# Patient Record
Sex: Male | Born: 1990 | Race: White | Hispanic: No | Marital: Single | Smoking: Never smoker
Health system: Southern US, Community
[De-identification: ages and names within clinical notes are randomized; demographics above are authoritative.]

---

## 2021-03-06 ENCOUNTER — Emergency Department (HOSPITAL_COMMUNITY)
Admission: EM | Admit: 2021-03-06 | Discharge: 2021-03-07 | Disposition: A | Discharge status: Other Acute Inpatient Hospital | Payer: Medicaid Other | Attending: Emergency Medicine | Admitting: Emergency Medicine

## 2021-03-06 ENCOUNTER — Encounter (HOSPITAL_COMMUNITY): Payer: Self-pay

## 2021-03-06 ENCOUNTER — Other Ambulatory Visit: Payer: Self-pay

## 2021-03-06 DIAGNOSIS — R454 Irritability and anger: Secondary | ICD-10-CM | POA: Insufficient documentation

## 2021-03-06 DIAGNOSIS — Y900 Blood alcohol level of less than 20 mg/100 ml: Secondary | ICD-10-CM | POA: Diagnosis not present

## 2021-03-06 DIAGNOSIS — F209 Schizophrenia, unspecified: Secondary | ICD-10-CM | POA: Insufficient documentation

## 2021-03-06 DIAGNOSIS — Z79899 Other long term (current) drug therapy: Secondary | ICD-10-CM | POA: Diagnosis not present

## 2021-03-06 DIAGNOSIS — R451 Restlessness and agitation: Secondary | ICD-10-CM | POA: Diagnosis present

## 2021-03-06 DIAGNOSIS — Z20822 Contact with and (suspected) exposure to covid-19: Secondary | ICD-10-CM | POA: Insufficient documentation

## 2021-03-06 DIAGNOSIS — F29 Unspecified psychosis not due to a substance or known physiological condition: Secondary | ICD-10-CM

## 2021-03-06 DIAGNOSIS — R4689 Other symptoms and signs involving appearance and behavior: Secondary | ICD-10-CM

## 2021-03-06 LAB — CBC WITH DIFFERENTIAL/PLATELET
Abs Immature Granulocytes: 0.05 10*3/uL (ref 0.00–0.07)
Basophils Absolute: 0.1 10*3/uL (ref 0.0–0.1)
Basophils Relative: 1 %
Eosinophils Absolute: 0.1 10*3/uL (ref 0.0–0.5)
Eosinophils Relative: 1 %
HCT: 46.5 % (ref 39.0–52.0)
Hemoglobin: 15.7 g/dL (ref 13.0–17.0)
Immature Granulocytes: 1 %
Lymphocytes Relative: 20 %
Lymphs Abs: 2 10*3/uL (ref 0.7–4.0)
MCH: 29.6 pg (ref 26.0–34.0)
MCHC: 33.8 g/dL (ref 30.0–36.0)
MCV: 87.7 fL (ref 80.0–100.0)
Monocytes Absolute: 0.8 10*3/uL (ref 0.1–1.0)
Monocytes Relative: 8 %
Neutro Abs: 7.1 10*3/uL (ref 1.7–7.7)
Neutrophils Relative %: 69 %
Platelets: 272 10*3/uL (ref 150–400)
RBC: 5.3 MIL/uL (ref 4.22–5.81)
RDW: 14.1 % (ref 11.5–15.5)
WBC: 10.1 10*3/uL (ref 4.0–10.5)
nRBC: 0 % (ref 0.0–0.2)

## 2021-03-06 LAB — COMPREHENSIVE METABOLIC PANEL
ALT: 20 U/L (ref 0–44)
AST: 18 U/L (ref 15–41)
Albumin: 4.9 g/dL (ref 3.5–5.0)
Alkaline Phosphatase: 68 U/L (ref 38–126)
Anion gap: 11 (ref 5–15)
BUN: 9 mg/dL (ref 6–20)
CO2: 28 mmol/L (ref 22–32)
Calcium: 9.7 mg/dL (ref 8.9–10.3)
Chloride: 104 mmol/L (ref 98–111)
Creatinine, Ser: 0.91 mg/dL (ref 0.61–1.24)
GFR, Estimated: >60 mL/min (ref >60)
Glucose, Bld: 104 mg/dL — ABNORMAL HIGH (ref 70–99)
Potassium: 3.7 mmol/L (ref 3.5–5.1)
Sodium: 143 mmol/L (ref 135–145)
Total Bilirubin: 0.8 mg/dL (ref 0.3–1.2)
Total Protein: 7.5 g/dL (ref 6.5–8.1)

## 2021-03-06 LAB — RAPID URINE DRUG SCREEN, HOSP PERFORMED
Amphetamines: NOT DETECTED
Barbiturates: NOT DETECTED
Benzodiazepines: NOT DETECTED
Cocaine: NOT DETECTED
Opiates: NOT DETECTED
Tetrahydrocannabinol: NOT DETECTED

## 2021-03-06 LAB — ETHANOL: Alcohol, Ethyl (B): <10 mg/dL (ref <10)

## 2021-03-06 MED ORDER — RISPERIDONE 1 MG PO TABS
1.0000 mg | ORAL_TABLET | Freq: Two times a day (BID) | ORAL | Status: DC
  Administered 2021-03-07: 1 mg via ORAL
  Filled 2021-03-06: qty 1

## 2021-03-06 MED ORDER — BENZTROPINE MESYLATE 0.5 MG PO TABS
0.5000 mg | ORAL_TABLET | Freq: Two times a day (BID) | ORAL | Status: DC
  Administered 2021-03-07: 0.5 mg via ORAL
  Filled 2021-03-06: qty 1

## 2021-03-06 NOTE — ED Provider Notes (Signed)
Emergency Medicine Provider Triage Evaluation Note  Justin Ponce , a 30 y.o. male  was evaluated in triage.  Pt complains of aggressive behavior. Recently dx with psych illness and was admitted. Mom states meds not working.  Review of Systems  Positive: Aggressive behavior Negative: Si, hi avh  Physical Exam  BP 137/71 (BP Location: Right Arm)   Pulse 78   Temp 98.7 F (37.1 C) (Oral)   Resp 18   Ht 6\' 2"  (1.88 m)   Wt 95.3 kg   SpO2 100%   BMI 26.96 kg/m  Gen:   Awake, no distress   Resp:  Normal effort  MSK:   Moves extremities without difficulty   Medical Decision Making  Medically screening exam initiated at 4:35 PM.  Appropriate orders placed.  Justin Ponce was informed that the remainder of the evaluation will be completed by another provider, this initial triage assessment does not replace that evaluation, and the importance of remaining in the ED until their evaluation is complete.     Justin Ponce 03/06/21 1637    03/08/21, MD 03/07/21 [phone]
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
## 2021-03-06 NOTE — ED Provider Notes (Signed)
[facility]   CSN: [id number] Arrival date & time: 03/06/21  1326     History Chief Complaint  Patient presents with   Psychiatric Evaluation    Justin Ponce is a 30 y.o. male.  Patient to the ED voluntarily for evaluation of feelings of restlessness, anger since starting his current medications. He reports he has told providers about his side effects but his medications (Cogentin and Risperdal) have not been changed. No SI/HI. Here with mother who is his guardian.   The history is provided by the patient. No language interpreter was used.      History reviewed. No pertinent past medical history.  There are no problems to display for this patient.   History reviewed. No pertinent surgical history.     No family history on file.     Home Medications Prior to Admission medications   Medication Sig Start Date End Date Taking? Authorizing Provider  benztropine (COGENTIN) 0.5 MG tablet Take 0.5 mg by mouth 2 (two) times daily. 02/28/21   [provider]  risperiDONE (RISPERDAL) 1 MG tablet Take 1 mg by mouth 2 (two) times daily. 02/28/21   [provider]    Allergies    Penicillins  Review of Systems   Review of Systems  Constitutional:  Negative for chills and fever.  HENT: Negative.    Respiratory: Negative.    Cardiovascular: Negative.   Gastrointestinal: Negative.   Musculoskeletal: Negative.   Skin: Negative.   Neurological: Negative.   Psychiatric/Behavioral:  Positive for agitation. Negative for suicidal ideas.    Physical Exam Updated Vital Signs BP 137/71 (BP Location: Right Arm)   Pulse 78   Temp 98.7 F (37.1 C) (Oral)   Resp 18   Ht 6\' 2"  (1.88 m)   Wt 95.3 kg   SpO2 100%   BMI 26.96 kg/m   Physical Exam Constitutional:      Appearance: He is well-developed.  Pulmonary:     Effort: Pulmonary effort is normal.  Musculoskeletal:        General: Normal range of motion.      Cervical back: Normal range of motion.  Skin:    General: Skin is warm and dry.  Neurological:     Mental Status: He is alert and oriented to person, place, and time.    ED Results / Procedures / Treatments   Labs (all labs ordered are listed, but only abnormal results are displayed) Labs Reviewed  COMPREHENSIVE METABOLIC PANEL - Abnormal; Notable for the following components:      Result Value   Glucose, Bld 104 (*)    All other components within normal limits  CBC WITH DIFFERENTIAL/PLATELET  ETHANOL  RAPID URINE DRUG SCREEN, HOSP PERFORMED    EKG None  Radiology No results found.  Procedures Procedures   Medications Ordered in ED Medications  benztropine (COGENTIN) tablet 0.5 mg (has no administration in time range)  risperiDONE (RISPERDAL) tablet 1 mg (has no administration in time range)    ED Course  I have reviewed the triage vital signs and the nursing notes.  Pertinent labs & imaging results that were available during my care of the patient were reviewed by me and considered in my medical decision making (see chart for details).    MDM Rules/Calculators/A&P                           Patient to ED for TTS evaluation  regarding adverse side effects of medications he is taking, including feeling aggressive and angry.   TTS evaluation performed and it is recommended that the patient stary in the ED under observation until morning psych re-evaluation. Patient and mother updated.   Final Clinical Impression(s) / ED Diagnoses Final diagnoses:  None   Anger History of schizophrenia  Rx / DC Orders ED Discharge Orders     None        Elpidio Anis, PA-C 03/07/21 1191    Tegeler, Canary Brim, MD 03/07/21 1700

## 2021-03-06 NOTE — ED Triage Notes (Signed)
Pt reports he is having adverse reaction to his pysch meds stating "im having trouble controlling my emotions and having some aggression issues. Mom reports pt was just under IVC  at [facility] from 7/16-8/14. Pt has since been seen at [facility] and pt was prescribed Benztropine 0.5 mg and Risperidone 1 mg and medication has not been effective.

## 2021-03-07 ENCOUNTER — Inpatient Hospital Stay (HOSPITAL_COMMUNITY)
Admission: AD | Admit: 2021-03-07 | Discharge: 2021-03-17 | DRG: 885 | Disposition: A | Discharge status: Home / Self Care | Payer: Federal, State, Local not specified - Other | Source: Intra-hospital | Attending: Obstetrics and Gynecology | Admitting: Obstetrics and Gynecology

## 2021-03-07 ENCOUNTER — Encounter (HOSPITAL_COMMUNITY): Payer: Self-pay | Admitting: Family Medicine

## 2021-03-07 ENCOUNTER — Other Ambulatory Visit: Payer: Self-pay

## 2021-03-07 DIAGNOSIS — F209 Schizophrenia, unspecified: Secondary | ICD-10-CM

## 2021-03-07 DIAGNOSIS — F2081 Schizophreniform disorder: Secondary | ICD-10-CM | POA: Diagnosis present

## 2021-03-07 DIAGNOSIS — G4700 Insomnia, unspecified: Secondary | ICD-10-CM | POA: Diagnosis present

## 2021-03-07 DIAGNOSIS — Z880 Allergy status to penicillin: Secondary | ICD-10-CM

## 2021-03-07 DIAGNOSIS — R569 Unspecified convulsions: Secondary | ICD-10-CM | POA: Diagnosis present

## 2021-03-07 DIAGNOSIS — Z79899 Other long term (current) drug therapy: Secondary | ICD-10-CM

## 2021-03-07 DIAGNOSIS — F29 Unspecified psychosis not due to a substance or known physiological condition: Secondary | ICD-10-CM | POA: Diagnosis not present

## 2021-03-07 DIAGNOSIS — F4010 Social phobia, unspecified: Secondary | ICD-10-CM | POA: Diagnosis present

## 2021-03-07 DIAGNOSIS — K0889 Other specified disorders of teeth and supporting structures: Secondary | ICD-10-CM | POA: Diagnosis present

## 2021-03-07 DIAGNOSIS — Z818 Family history of other mental and behavioral disorders: Secondary | ICD-10-CM | POA: Diagnosis not present

## 2021-03-07 DIAGNOSIS — R454 Irritability and anger: Secondary | ICD-10-CM | POA: Diagnosis not present

## 2021-03-07 DIAGNOSIS — Z20822 Contact with and (suspected) exposure to covid-19: Secondary | ICD-10-CM | POA: Diagnosis present

## 2021-03-07 LAB — RESP PANEL BY RT-PCR (FLU A&B, COVID) ARPGX2
Influenza A by PCR: NEGATIVE
Influenza B by PCR: NEGATIVE
SARS Coronavirus 2 by RT PCR: NEGATIVE

## 2021-03-07 MED ORDER — ALUM & MAG HYDROXIDE-SIMETH 200-200-20 MG/5ML PO SUSP
30.0000 mL | ORAL | Status: DC | PRN
Start: 2021-03-07 — End: 2021-03-17

## 2021-03-07 MED ORDER — TRAZODONE HCL 50 MG PO TABS
50.0000 mg | ORAL_TABLET | Freq: Every evening | ORAL | Status: DC | PRN
  Administered 2021-03-07 – 2021-03-11 (×5): 50 mg via ORAL
  Filled 2021-03-07 (×5): qty 1

## 2021-03-07 MED ORDER — ACETAMINOPHEN 325 MG PO TABS
650.0000 mg | ORAL_TABLET | Freq: Four times a day (QID) | ORAL | Status: DC | PRN
Start: 2021-03-07 — End: 2021-03-17
  Administered 2021-03-09 – 2021-03-13 (×4): 650 mg via ORAL
  Filled 2021-03-07 (×4): qty 2

## 2021-03-07 MED ORDER — LORAZEPAM 1 MG PO TABS: 1.0000 mg | ORAL_TABLET | Freq: Four times a day (QID) | ORAL | Status: DC | PRN

## 2021-03-07 MED ORDER — LORAZEPAM 2 MG/ML IJ SOLN
2.0000 mg | Freq: Once | INTRAMUSCULAR | Status: AC
  Administered 2021-03-07: 2 mg via INTRAMUSCULAR
  Filled 2021-03-07: qty 1

## 2021-03-07 MED ORDER — MAGNESIUM HYDROXIDE 400 MG/5ML PO SUSP: 30.0000 mL | Freq: Every day | ORAL | Status: DC | PRN

## 2021-03-07 MED ORDER — HYDROXYZINE HCL 25 MG PO TABS
25.0000 mg | ORAL_TABLET | Freq: Three times a day (TID) | ORAL | Status: DC | PRN
  Administered 2021-03-07 – 2021-03-11 (×3): 25 mg via ORAL
  Filled 2021-03-07 (×4): qty 1

## 2021-03-07 MED ORDER — OLANZAPINE 10 MG PO TBDP
10.0000 mg | ORAL_TABLET | Freq: Every day | ORAL | Status: DC
  Administered 2021-03-07: 10 mg via ORAL
  Filled 2021-03-07: qty 1

## 2021-03-07 MED ORDER — OLANZAPINE 10 MG PO TBDP
10.0000 mg | ORAL_TABLET | Freq: Every day | ORAL | Status: DC
  Filled 2021-03-07 (×3): qty 1

## 2021-03-07 NOTE — ED Provider Notes (Signed)
Emergency Medicine Observation Re-evaluation Note  Justin Ponce is a 30 y.o. male, seen on rounds today.  Pt initially presented to the ED for complaints of Psychiatric Evaluation Currently, the patient is resting comfortably.  Physical Exam  BP 132/76 (BP Location: Right Arm)   Pulse 75   Temp 98.7 F (37.1 C) (Oral)   Resp 18   Ht 6\' 2"  (1.88 m)   Wt 95.3 kg   SpO2 100%   BMI 26.96 kg/m  Physical Exam General: Nontoxic appearing Cardiac: Normal heart Lungs: Normal respiratory rate Psych: Calm  ED Course / MDM  EKG:   I have reviewed the labs performed to date as well as medications administered while in observation.  Recent changes in the last 24 hours include cooperative with efforts to help him.  Plan  Current plan is for psychiatric hospitalization.  Ed Mandich is not under involuntary commitment.     [name], MD 03/07/21 [phone]

## 2021-03-07 NOTE — ED Notes (Signed)
Safe transport called for transport to [facility] 

## 2021-03-07 NOTE — BH Assessment (Signed)
[facility] Assessment Progress Note   Per Dorena Bodo, NP, this pt requires psychiatric hospitalization at this time.  Linsey, RN, [facility] has assigned pt to [facility] Rm [id number] to the service of Dr Mason Jim.  This Clinical research associate has spoke to pt's mother/legal guardian (letter of guardianship in chart), who agrees to admission.  I have sent  Voluntary Admission and Consent for Treatment to her by email attachment, which she has signed and sent back to me.  Signed form has been faxed to [facility].  EDP Mancel Bale, MD and pt's nurse, Ladona Ridgel, have been notified, and Ladona Ridgel agrees to send original paperwork along with pt via [facility], and to call report to [phone].  Doylene Canning, [place] [facility] Coordinator [phone]

## 2021-03-07 NOTE — Consult Note (Signed)
[facility] Progress [facility]  03/07/2021 12:03 PM Justin Ponce  MRN:  [id number]   Method of visit?: Face to Face   Subjective: per admit note:   Patient to the ED voluntarily for evaluation of feelings of restlessness, anger since starting his current medications. He reports he has told providers about his side effects but his medications (Cogentin and Risperdal) have not been changed. No SI/HI. Here with mother who is his guardian.   Today, patient [facility] Alert and oriented x 3, lying calmly on stretcher. Patient sat up upon arrival for interview. He reports he is here due to needing medication changes and/or needing his medications removed due to side effects. [facility] when they started him on Risperdal and Cogentin. He believes he is reacting to these meds and causing him to have aggressive behaviors. He reports that he drank a 1/5 of vodka that started this whole "incident" (unsure of timeframe of this incident, reports no alcohol in 5 years).  Denies suicidal ideation, denies homicidal ideation, when asked about hallucinations, he reports, "I can see visions of things before they happen and they always happen". Describes as visions.  Lives with mother, Misty StanleyLisa, who is legal guardian and live with dad. Denies substance use, and current alcohol use.   Collateral: spoke with mother, Misty StanleyLisa who describes that patient is having delusional thoughts and she gave several examples of this. She reports that he presents well during psych evaluations and unless he is asked about specific things he will not give the information. He did speak of his visions to this Clinical research associatewriter. Mother is requesting inpatient psychiatric admission (staffed with Dr.Kumar who agrees this patient meets criteria for inpatient psychiatric admission).     Principal Problem: Schizophrenia (HCC) Diagnosis:  Principal Problem:   Schizophrenia (HCC)  Total Time spent with  patient: 30 minutes  Past Psychiatric History:   Past Medical History: History reviewed. No pertinent past medical history. History reviewed. No pertinent surgical history. Family History: No family history on file. Family Psychiatric  History:  Social History:  Social History   Substance and Sexual Activity  Alcohol Use None     Social History   Substance and Sexual Activity  Drug Use Not on file    Social History   Socioeconomic History   Marital status: Single    Spouse name: Not on file   Number of children: Not on file   Years of education: Not on file   Highest education level: Not on file  Occupational History   Not on file  Tobacco Use   Smoking status: Not on file   Smokeless tobacco: Not on file  Substance and Sexual Activity   Alcohol use: Not on file   Drug use: Not on file   Sexual activity: Not on file  Other Topics Concern   Not on file  Social History Narrative   Not on file   Social Determinants of Health   Financial Resource Strain: Not on file  Food Insecurity: Not on file  Transportation Needs: Not on file  Physical Activity: Not on file  Stress: Not on file  Social Connections: Not on file    Sleep: Fair  Appetite:  Good  Current Medications: Current Facility-Administered Medications  Medication Dose Route Frequency Provider Last Rate Last Admin   OLANZapine zydis (ZYPREXA) disintegrating tablet 10 mg  10 mg Oral Daily Novella Oliveolby, Nohelia Valenza R, NP  10 mg at 03/07/21 1043   Current Outpatient Medications  Medication Sig Dispense Refill   benztropine (COGENTIN) 0.5 MG tablet Take 0.5 mg by mouth 2 (two) times daily.     risperiDONE (RISPERDAL) 1 MG tablet Take 1 mg by mouth 2 (two) times daily.      Lab Results:  Results for orders placed or performed during the hospital encounter of 03/06/21 (from the past 48 hour(s))  CBC with Differential     Status: None   Collection Time: 03/06/21  9:34 PM  Result Value Ref Range   WBC 10.1 4.0 -  10.5 K/uL   RBC 5.30 4.22 - 5.81 MIL/uL   Hemoglobin 15.7 13.0 - 17.0 g/dL   HCT 42.6 83.4 - 19.6 %   MCV 87.7 80.0 - 100.0 fL   MCH 29.6 26.0 - 34.0 pg   MCHC 33.8 30.0 - 36.0 g/dL   RDW 22.2 97.9 - 89.2 %   Platelets 272 150 - 400 K/uL   nRBC 0.0 0.0 - 0.2 %   Neutrophils Relative % 69 %   Neutro Abs 7.1 1.7 - 7.7 K/uL   Lymphocytes Relative 20 %   Lymphs Abs 2.0 0.7 - 4.0 K/uL   Monocytes Relative 8 %   Monocytes Absolute 0.8 0.1 - 1.0 K/uL   Eosinophils Relative 1 %   Eosinophils Absolute 0.1 0.0 - 0.5 K/uL   Basophils Relative 1 %   Basophils Absolute 0.1 0.0 - 0.1 K/uL   Immature Granulocytes 1 %   Abs Immature Granulocytes 0.05 0.00 - 0.07 K/uL    Comment: Performed at [facility], [address] [place], [place] [address]  Comprehensive metabolic panel     Status: Abnormal   Collection Time: 03/06/21  9:34 PM  Result Value Ref Range   Sodium 143 135 - 145 mmol/L   Potassium 3.7 3.5 - 5.1 mmol/L   Chloride 104 98 - 111 mmol/L   CO2 28 22 - 32 mmol/L   Glucose, Bld 104 (H) 70 - 99 mg/dL    Comment: Glucose reference range applies only to samples taken after fasting for at least 8 hours.   BUN 9 6 - 20 mg/dL   Creatinine, Ser 7.40 0.61 - 1.24 mg/dL   Calcium 9.7 8.9 - 81.4 mg/dL   Total Protein 7.5 6.5 - 8.1 g/dL   Albumin 4.9 3.5 - 5.0 g/dL   AST 18 15 - 41 U/L   ALT 20 0 - 44 U/L   Alkaline Phosphatase 68 38 - 126 U/L   Total Bilirubin 0.8 0.3 - 1.2 mg/dL   GFR, Estimated >48 >18 mL/min    Comment: (NOTE) Calculated using the CKD-EPI Creatinine Equation (2021)    Anion gap 11 5 - 15    Comment: Performed at [facility], [address], [place], [place] [address]  Ethanol     Status: None   Collection Time: 03/06/21  9:34 PM  Result Value Ref Range   Alcohol, Ethyl (B) <10 <10 mg/dL    Comment: (NOTE) Lowest detectable limit for serum alcohol is 10 mg/dL.  For medical purposes only. Performed at [facility], [address], [place], [place] [address]   Rapid urine drug screen (hospital performed)     Status: None   Collection Time: 03/06/21 10:06 PM  Result Value Ref Range   Opiates NONE DETECTED NONE DETECTED   Cocaine NONE DETECTED NONE DETECTED   Benzodiazepines NONE DETECTED NONE  DETECTED   Amphetamines NONE DETECTED NONE DETECTED   Tetrahydrocannabinol NONE DETECTED NONE DETECTED   Barbiturates NONE DETECTED NONE DETECTED    Comment: (NOTE) DRUG SCREEN FOR MEDICAL PURPOSES ONLY.  IF CONFIRMATION IS NEEDED FOR ANY PURPOSE, NOTIFY LAB WITHIN 5 DAYS.  LOWEST DETECTABLE LIMITS FOR URINE DRUG SCREEN Drug Class                     Cutoff (ng/mL) Amphetamine and metabolites    1000 Barbiturate and metabolites    200 Benzodiazepine                 200 Tricyclics and metabolites     300 Opiates and metabolites        300 Cocaine and metabolites        300 THC                            50 Performed at [facility], [address] [place], [place] [address]   Resp Panel by RT-PCR (Flu A&B, Covid) Nasopharyngeal Swab     Status: None   Collection Time: 03/07/21  2:34 AM   Specimen: Nasopharyngeal Swab; Nasopharyngeal(NP) swabs in vial transport medium  Result Value Ref Range   SARS Coronavirus 2 by RT PCR NEGATIVE NEGATIVE    Comment: (NOTE) SARS-CoV-2 target nucleic acids are NOT DETECTED.  The SARS-CoV-2 RNA is generally detectable in upper respiratory specimens during the acute phase of infection. The lowest concentration of SARS-CoV-2 viral copies this assay can detect is 138 copies/mL. A negative result does not preclude SARS-Cov-2 infection and should not be used as the sole basis for treatment or other patient management decisions. A negative result may occur with  improper specimen collection/handling, submission of specimen other than nasopharyngeal swab, presence of viral mutation(s) within the areas targeted by this assay, and  inadequate number of viral copies(<138 copies/mL). A negative result must be combined with clinical observations, patient history, and epidemiological information. The expected result is Negative.  Fact Sheet for Patients:  BloggerCourse.com  Fact Sheet for Healthcare Providers:  SeriousBroker.it  This test is no t yet approved or cleared by the [place] FDA and  has been authorized for detection and/or diagnosis of SARS-CoV-2 by FDA under an Emergency Use Authorization (EUA). This EUA will remain  in effect (meaning this test can be used) for the duration of the COVID-19 declaration under Section 564(b)(1) of the Act, 21 U.S.C.section 360bbb-3(b)(1), unless the authorization is terminated  or revoked sooner.       Influenza A by PCR NEGATIVE NEGATIVE   Influenza B by PCR NEGATIVE NEGATIVE    Comment: (NOTE) The Xpert Xpress SARS-CoV-2/FLU/RSV plus assay is intended as an aid in the diagnosis of influenza from Nasopharyngeal swab specimens and should not be used as a sole basis for treatment. Nasal washings and aspirates are unacceptable for Xpert Xpress SARS-CoV-2/FLU/RSV testing.  Fact Sheet for Patients: BloggerCourse.com  Fact Sheet for Healthcare Providers: SeriousBroker.it  This test is not yet approved or cleared by the [place] FDA and has been authorized for detection and/or diagnosis of SARS-CoV-2 by FDA under an Emergency Use Authorization (EUA). This EUA will remain in effect (meaning this test can be used) for the duration of the COVID-19 declaration under Section 564(b)(1) of the Act, 21 U.S.C. section 360bbb-3(b)(1), unless the authorization is terminated or revoked.  Performed at [facility], [address] [place], [place] [address]     Blood Alcohol level:  Lab Results  Component Value Date   ETH <10 03/06/2021     Physical Findings: AIMS:  , ,  ,  ,    CIWA:    COWS:     Musculoskeletal: Strength & Muscle Tone: within normal limits Gait & Station: normal Patient leans: N/A  Psychiatric Specialty Exam:  Presentation  General Appearance:  Appropriate for Environment Eye Contact: Good Speech: Clear and Coherent Speech Volume: Normal Handedness: No data recorded  Mood and Affect  Mood: Euthymic Affect: Congruent  Thought Process  Thought Processes: Coherent Descriptions of Associations:Intact Orientation:Full (Time, Place and Person) Thought Content:Logical History of Schizophrenia/Schizoaffective disorder:No  Duration of Psychotic Symptoms:No data recorded Hallucinations:Hallucinations: None Ideas of Reference:None Suicidal Thoughts:Suicidal Thoughts: No Homicidal Thoughts:Homicidal Thoughts: No  Sensorium  Memory: Immediate Good; Recent Good; Remote Good Judgment: Fair Insight: Fair  Art therapist  Concentration: Good Attention Span: Good Recall: Good Fund of Knowledge: Good Language: Good  Psychomotor Activity  Psychomotor Activity: Psychomotor Activity: Normal  Assets  Assets: Communication Skills; Desire for Improvement; Housing; Physical Health; Social Support  Sleep  Sleep: Sleep: Fair Number of Hours of Sleep: 5   Physical Exam: Physical Exam Vitals reviewed.  Cardiovascular:     Rate and Rhythm: Normal rate.  Pulmonary:     Effort: Pulmonary effort is normal.  Musculoskeletal:        General: Normal range of motion.  Skin:    General: Skin is warm and dry.  Neurological:     Mental Status: He is alert and oriented to person, place, and time.  Psychiatric:        Attention and Perception: Attention normal.        Mood and Affect: Mood normal.        Speech: Speech normal.        Behavior: Behavior is cooperative.        Thought Content: Thought content is delusional (sees visions before they happen). Thought content  is not paranoid. Thought content does not include homicidal or suicidal ideation. Thought content does not include homicidal or suicidal plan.   Review of Systems  Constitutional:  Negative for chills and fever.  Respiratory:  Negative for shortness of breath.   Cardiovascular:  Negative for chest pain.  Gastrointestinal:  Negative for abdominal pain, nausea and vomiting.  Neurological:  Negative for headaches.  Psychiatric/Behavioral:  Positive for hallucinations. Negative for depression, substance abuse and suicidal ideas. The patient is not nervous/anxious and does not have insomnia.   Blood pressure 134/80, pulse 87, temperature 98.2 F (36.8 C), temperature source Oral, resp. rate 17, height 6\' 2"  (1.88 m), weight 95.3 kg, SpO2 100 %. Body mass index is 26.96 kg/m.  Treatment Plan Summary: Daily contact with patient to assess and evaluate symptoms and progress in treatment, Medication management, and Plan Meets criteria for inpatient psychiatric admission.   Consulted with Dr. :  Risperdal and cogentin discontinued and Zyprexa 10 mg PO QD started. EKG ordered: QTc 421 today.  EKG for 03/08/21.   03/10/21, NP 03/07/2021, 12:03 PM

## 2021-03-07 NOTE — Progress Notes (Signed)
The focus of this group is to help patients review their daily goal of treatment and discuss progress on daily workbooks. Pt attended the evening group session and responded to all discussion prompts from the Writer. Pt shared that today was an "okay" day on the unit, the highlight of which was getting to talk with his father, whom he considers a support person.  During wrap-up, Pts discussed ways to cope with anxiety. Justin Ponce mentioned that his most effective coping skill was to go on long walks, which would then tire him out, enabling him to sleep well. "That's why I've had trouble sleeping here. It's not the same."  Pt rated his day a 5 out of 10 and his affect was appropriate.

## 2021-03-07 NOTE — Progress Notes (Signed)
Admission Note: Patient is a 30 year old male admitted to the unit from [facility] for complaint of having adverse reaction from Cogentin and Risperdal.  Reports symptoms of aggression, short temper, thought-blocking, and thought of violence started immediately after taking these medications.  Stated he is here for medication adjustment or change.  Patient presents with a flat affect and depressed mood.  Admission plan of care reviewed with treatment consent signed.  Skin assessment completed.  Skin is dry and intact.  Patient has no personal belongings on admission. Verbalizes understanding of unit rules/protocols.  Patient oriented to the unit, staff and room.  Routine safety checks initiated.  Patient is safe on the unit at this time.

## 2021-03-07 NOTE — Tx Team (Signed)
Initial Treatment Plan 03/07/2021 6:37 PM Justin Ponce [id number]    PATIENT STRESSORS: Health problems   Medication change or noncompliance     PATIENT STRENGTHS: Communication skills  Motivation for treatment/growth  Supportive family/friends    PATIENT IDENTIFIED PROBLEMS: "Thought of violence d/t medication S/E"  "Medication causing thought-blocking"  Depression  Homicidal ideation               DISCHARGE CRITERIA:  Adequate post-discharge living arrangements Motivation to continue treatment in a less acute level of care Safe-care adequate arrangements made  PRELIMINARY DISCHARGE PLAN: Attend PHP/IOP Outpatient therapy Return to previous living arrangement  PATIENT/FAMILY INVOLVEMENT: This treatment plan has been presented to and reviewed with the patient, Justin Ponce, and/or family member.  The patient and family have been given the opportunity to ask questions and make suggestions.  Clarene Critchley, RN 03/07/2021, 6:37 PM

## 2021-03-07 NOTE — ED Notes (Signed)
Pt mom took belongings home with her.

## 2021-03-08 ENCOUNTER — Inpatient Hospital Stay (HOSPITAL_COMMUNITY): Payer: Federal, State, Local not specified - Other

## 2021-03-08 ENCOUNTER — Encounter (HOSPITAL_COMMUNITY): Payer: Self-pay

## 2021-03-08 DIAGNOSIS — R569 Unspecified convulsions: Secondary | ICD-10-CM

## 2021-03-08 DIAGNOSIS — F4325 Adjustment disorder with mixed disturbance of emotions and conduct: Secondary | ICD-10-CM | POA: Insufficient documentation

## 2021-03-08 DIAGNOSIS — F29 Unspecified psychosis not due to a substance or known physiological condition: Secondary | ICD-10-CM

## 2021-03-08 LAB — LIPID PANEL
Cholesterol: 159 mg/dL (ref 0–200)
HDL: 43 mg/dL (ref >40)
LDL Cholesterol: 103 mg/dL — ABNORMAL HIGH (ref 0–99)
Total CHOL/HDL Ratio: 3.7 RATIO
Triglycerides: 67 mg/dL (ref <150)
VLDL: 13 mg/dL (ref 0–40)

## 2021-03-08 LAB — TSH: TSH: 1.052 u[IU]/mL (ref 0.350–4.500)

## 2021-03-08 LAB — SEDIMENTATION RATE: Sed Rate: 1 mm/hr (ref 0–16)

## 2021-03-08 LAB — VITAMIN B12: Vitamin B-12: 255 pg/mL (ref 180–914)

## 2021-03-08 MED ORDER — OLANZAPINE 10 MG PO TBDP
10.0000 mg | ORAL_TABLET | Freq: Three times a day (TID) | ORAL | Status: DC | PRN
  Administered 2021-03-11: 10 mg via ORAL
  Filled 2021-03-08: qty 1

## 2021-03-08 MED ORDER — ARIPIPRAZOLE 5 MG PO TABS
5.0000 mg | ORAL_TABLET | Freq: Every day | ORAL | Status: DC
  Administered 2021-03-09 – 2021-03-10 (×2): 5 mg via ORAL
  Filled 2021-03-08 (×4): qty 1

## 2021-03-08 MED ORDER — LORAZEPAM 1 MG PO TABS: 1.0000 mg | ORAL_TABLET | ORAL | Status: DC | PRN

## 2021-03-08 MED ORDER — ZIPRASIDONE MESYLATE 20 MG IM SOLR: 20.0000 mg | INTRAMUSCULAR | Status: DC | PRN

## 2021-03-08 NOTE — [facility] Group Notes (Signed)
[facility] Group Notes:  (Nursing/MHT/Case Management/Adjunct)  Date:  03/08/2021  Time:  3:00 PM  Type of Therapy:  Group Therapy  Participation Level:  Did Not Attend  Patient did not attend group.  Osvaldo Human R Dakin Madani 03/08/2021, 3:00 PM

## 2021-03-08 NOTE — [facility] Group Notes (Signed)
[facility] Group Notes:  (Nursing/MHT/Case Management/Adjunct)  Date:  03/08/2021  Time:  3:01 PM  Type of Therapy:  Psychoeducational Skills  Participation Level:  Did Not Attend  Patient did not attend group.  Daneil Dan 03/08/2021, 3:01 PM

## 2021-03-08 NOTE — Progress Notes (Signed)
Pt did not participate with group.
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
## 2021-03-08 NOTE — BH IP Treatment Plan (Signed)
Interdisciplinary Treatment and Diagnostic Plan Update  03/08/2021 Time of Session: 9:25am  Dominie Benedick MRN: [id number]  Principal Diagnosis: <principal problem not specified>  Secondary Diagnoses: Active Problems:   Schizophrenia ([place])   Adjustment disorder with mixed disturbance of emotions and conduct   Current Medications:  Current Facility-Administered Medications  Medication Dose Route Frequency Provider Last Rate Last Admin   acetaminophen (TYLENOL) tablet 650 mg  650 mg Oral Q6H PRN Chalmers Guest, NP       alum & mag hydroxide-simeth (MAALOX/MYLANTA) 200-200-20 MG/5ML suspension 30 mL  30 mL Oral Q4H PRN Chalmers Guest, NP       hydrOXYzine (ATARAX/VISTARIL) tablet 25 mg  25 mg Oral TID PRN Chalmers Guest, NP   25 mg at 03/07/21 2119   LORazepam (ATIVAN) tablet 1 mg  1 mg Oral Q6H PRN Chalmers Guest, NP       magnesium hydroxide (MILK OF MAGNESIA) suspension 30 mL  30 mL Oral Daily PRN Chalmers Guest, NP       OLANZapine zydis (ZYPREXA) disintegrating tablet 10 mg  10 mg Oral Daily Chalmers Guest, NP       traZODone (DESYREL) tablet 50 mg  50 mg Oral QHS PRN Chalmers Guest, NP   50 mg at 03/07/21 2120   PTA Medications: Medications Prior to Admission  Medication Sig Dispense Refill Last Dose   benztropine (COGENTIN) 0.5 MG tablet Take 0.5 mg by mouth 2 (two) times daily.      risperiDONE (RISPERDAL) 1 MG tablet Take 1 mg by mouth 2 (two) times daily.       Patient Stressors: Health problems   Medication change or noncompliance    Patient Strengths: Hydrographic surveyor for treatment/growth  Supportive family/friends   Treatment Modalities: Medication Management, Group therapy, Case management,  1 to 1 session with clinician, Psychoeducation, Recreational therapy.   Physician Treatment Plan for Primary Diagnosis: <principal problem not specified> Long Term Goal(s):     Short Term Goals:    Medication Management: Evaluate patient's response, side  effects, and tolerance of medication regimen.  Therapeutic Interventions: 1 to 1 sessions, Unit Group sessions and Medication administration.  Evaluation of Outcomes: Not Met  Physician Treatment Plan for Secondary Diagnosis: Active Problems:   Schizophrenia ([place])   Adjustment disorder with mixed disturbance of emotions and conduct  Long Term Goal(s):     Short Term Goals:       Medication Management: Evaluate patient's response, side effects, and tolerance of medication regimen.  Therapeutic Interventions: 1 to 1 sessions, Unit Group sessions and Medication administration.  Evaluation of Outcomes: Not Met   RN Treatment Plan for Primary Diagnosis: <principal problem not specified> Long Term Goal(s): Knowledge of disease and therapeutic regimen to maintain health will improve  Short Term Goals: Ability to remain free from injury will improve, Ability to participate in decision making will improve, Ability to verbalize feelings will improve, Ability to disclose and discuss suicidal ideas, and Ability to identify and develop effective coping behaviors will improve  Medication Management: RN will administer medications as ordered by provider, will assess and evaluate patient's response and provide education to patient for prescribed medication. RN will report any adverse and/or side effects to prescribing provider.  Therapeutic Interventions: 1 on 1 counseling sessions, Psychoeducation, Medication administration, Evaluate responses to treatment, Monitor vital signs and CBGs as ordered, Perform/monitor CIWA, COWS, AIMS and Fall Risk screenings as ordered, Perform wound care treatments as ordered.  Evaluation of  Outcomes: Not Met   LCSW Treatment Plan for Primary Diagnosis: <principal problem not specified> Long Term Goal(s): Safe transition to appropriate next level of care at discharge, Engage patient in therapeutic group addressing interpersonal concerns.  Short Term Goals: Engage  patient in aftercare planning with referrals and resources, Increase social support, Increase emotional regulation, Facilitate acceptance of mental health diagnosis and concerns, Identify triggers associated with mental health/substance abuse issues, and Increase skills for wellness and recovery  Therapeutic Interventions: Assess for all discharge needs, 1 to 1 time with Social worker, Explore available resources and support systems, Assess for adequacy in community support network, Educate family and significant other(s) on suicide prevention, Complete Psychosocial Assessment, Interpersonal group therapy.  Evaluation of Outcomes: Not Met   Progress in Treatment: Attending groups: No. Participating in groups: No. Taking medication as prescribed: Yes. Toleration medication: Yes. Family/Significant other contact made: Yes, individual(s) contacted:  If consents are provided  Patient understands diagnosis: No. Discussing patient identified problems/goals with staff: Yes. Medical problems stabilized or resolved: Yes. Denies suicidal/homicidal ideation: Yes. Issues/concerns per patient self-inventory: No.   New problem(s) identified: No, Describe:  None   New Short Term/Long Term Goal(s): medication stabilization, elimination of SI thoughts, development of comprehensive mental wellness plan.   Patient Goals:  "To get more sleep"  Discharge Plan or Barriers: Patient recently admitted. CSW will continue to follow and assess for appropriate referrals and possible discharge planning.   Reason for Continuation of Hospitalization: Aggression Delusions  Depression Medication stabilization Suicidal ideation  Estimated Length of Stay: 3 to 5 days    Scribe for Treatment Team: Darleen Crocker, Latanya Presser 03/08/2021 11:39 AM

## 2021-03-08 NOTE — [facility] Group Notes (Signed)
Topic:   Due to the acuity and Covid-19 precautions, group was not held. Patient was provided therapeutic worksheets and asked to meet with CSW as needed. 

## 2021-03-08 NOTE — Progress Notes (Signed)
Patient ID: Justin Ponce, male   DOB: May 23, 1991, 30 y.o.   MRN: [id number] Patient was able to sleep very well all night and was heard loudly snoring. He had resisted Trazodone and Vistaril last night stating they do not work, but meds were effective.

## 2021-03-08 NOTE — Plan of Care (Signed)
  Problem: Education: Goal: Knowledge of [facility] Education information/materials will improve Outcome: Progressing Goal: Emotional status will improve Outcome: Progressing Goal: Mental status will improve Outcome: Progressing Goal: Verbalization of understanding the information provided will improve Outcome: Progressing   

## 2021-03-08 NOTE — Progress Notes (Signed)
Progress note  Pt found in bed. Pt voiced concerns with their change in medication and refused morning dose. Pt felt that it was making them drool and slur their speech. Pt was provided reassurance. Pt states they would like to go back to risperdal. Pt was educated on other medications in the same class. Pt states they feel they can handle the agitation/anger that comes with risperdal now because they know "what to expect". Pt denies si/hi/ah/vh and verbally agrees to approach staff if these become apparent or before harming themselves/others while at [facility].  A: Pt provided support and encouragement. Pt given medication per protocol and standing orders. Q25m safety checks implemented and continued.  R: Pt safe on the unit. Will continue to monitor.
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
## 2021-03-08 NOTE — Progress Notes (Signed)
Recreation Therapy Notes  Date: 8.31.22 Time: 0930 Location: [address] Dayroom  Group Topic: Stress Management   Goal Area(s) Addresses:  Patient will actively participate in stress management techniques presented during session.  Patient will successfully identify benefit of practicing stress management post d/c.   Intervention: Relaxation exercise with ambient sound and script   Activity: Guided Imagery. LRT provided education, instruction, and demonstration on practice of visualization via guided imagery. Patient was asked to participate in the technique introduced during session. LRT debriefed including topics of mindfulness, stress management and specific scenarios each patient could use these techniques. Patients were given suggestions of ways to access scripts post d/c and encouraged to explore Youtube and other apps available on smartphones, tablets, and computers.  Education:  Stress Management, Discharge Planning.   Education Outcome: Acknowledges education  Clinical Observations/Feedback: Due to there being a COVID case on the unit group did not occur as scheduled.  LRT did give out packets dealing with symptoms and triggers of stress.  It also dealt with identifying things that are in your control and things that are not.     Caroll Rancher, LRT/CTRS         Caroll Rancher A 03/08/2021 11:34 AM

## 2021-03-08 NOTE — Progress Notes (Signed)
DAR Note: Pt with blunted affect and depressed mood, denies SI/HI/AVH. Pt came to the nurses' station during med times stating that: "the new medication is causing me trouble talking. It is slowing me down". This RN inquired which med pt was referring to, and he stated that it was the Zyprexa. Pt states that he will no longer  take the medication, and that he will rather take his old meds. Pt educated on this medication, and educated to talk to the Providers in the morning. Pt not scheduled to get Zyprexa this shift. Pt demanding to have Ativan, and stated that it was given to him "at the previous place, and that is the only thing that helped my sleep". Pt educated on Trazodone, but stated that Trazodone does not help him. Pt was educated on the fact that Benzodiazepines including Ativan are very habit forming, and encouraged to try the Trazodone along with Vistaril for insomnia. Pt agreed and was given both meds, and is currently in bed sleeping and does not seem to be in a distress. Q15 minute checks being maintained for safety.   03/08/21 0050  Psych Admission Type (Psych Patients Only)  Admission Status Voluntary  Psychosocial Assessment  Patient Complaints Anxiety  Eye Contact Brief  Facial Expression Flat  Affect Depressed;Sad  Speech Logical/coherent;Soft  Interaction Assertive  Motor Activity Slow  Appearance/Hygiene In scrubs  Behavior Characteristics Cooperative  Mood Anxious  Thought Process  Coherency WDL  Content WDL  Delusions None reported or observed  Perception WDL  Hallucination None reported or observed  Judgment WDL  Confusion None  Danger to Self  Current suicidal ideation? Denies  Danger to Others  Danger to Others None reported or observed

## 2021-03-09 LAB — HIV ANTIBODY (ROUTINE TESTING W REFLEX): HIV Screen 4th Generation wRfx: NONREACTIVE

## 2021-03-09 LAB — RPR: RPR Ser Ql: NONREACTIVE

## 2021-03-09 LAB — HEPATITIS PANEL, ACUTE
HCV Ab: NONREACTIVE
Hep A IgM: NONREACTIVE
Hep B C IgM: NONREACTIVE
Hepatitis B Surface Ag: NONREACTIVE

## 2021-03-09 LAB — HEMOGLOBIN A1C
Hgb A1c MFr Bld: 5.5 % (ref 4.8–5.6)
Mean Plasma Glucose: 111 mg/dL

## 2021-03-09 NOTE — Progress Notes (Signed)
[place] isolated much of the shift.  He came out for medications.  He denied SI/HI or AVH.  He stated his depression was 0/10 and anxiety 1/10 (10 the worst).  He is focused on being discharged.  "I will do what ever I need to do to get out of here." He denied any pain or discomfort and appeared to be in no physical distress.  Q 15 minute checks maintained for safety.   03/08/21 2142  Psych Admission Type (Psych Patients Only)  Admission Status Voluntary  Psychosocial Assessment  Patient Complaints Anxiety;Insomnia  Eye Contact Fair  Facial Expression Blank;Worried  Affect Anxious;Preoccupied  Surveyor, minerals Activity Other (Comment) (Unremarkable)  Appearance/Hygiene In scrubs  Behavior Characteristics Cooperative;Appropriate to situation  Mood Anxious;Preoccupied  Thought Process  Coherency Concrete thinking  Content Hypochondria  Delusions None reported or observed  Perception WDL  Hallucination None reported or observed  Judgment Poor  Confusion None  Danger to Self  Current suicidal ideation? Denies  Danger to Others  Danger to Others None reported or observed

## 2021-03-09 NOTE — [facility] Group Notes (Signed)
Adult Psychoeducational Group Note  Date:  03/09/2021 Time:  3:06 PM  Group Topic/Focus:  Developing a Wellness Toolbox:   The focus of this group is to help patients develop a "wellness toolbox" with skills and strategies to promote recovery upon discharge.  Participation Level:  Active  Participation Quality:  Appropriate  Affect:  Appropriate  Cognitive:  Alert  Insight: Appropriate  Engagement in Group:  Engaged  Modes of Intervention:  Activity  Additional Comments:  Patient attended and participated in the relaxation group.  Jearl Klinefelter 03/09/2021, 3:06 PM

## 2021-03-09 NOTE — Progress Notes (Signed)
[facility] MD Progress Note  03/09/2021 3:50 PM Justin Ponce  MRN:  [id number] Subjective:  Patient states he is doing good minus a toothache. He states he just wants to get out of here and will be happy once he gets out of here; currently he feels "OK". His anxiety level is 1-2 due to being stuck on the psychiatric unit; again emphasizes that . He slept OK but had to be medicated to sleep; again emphasizing that things will be better once he is discharged. Appetite is OK, Denies SI/HI/AH/VH endorsed. Makes brief jokes with team. He has a toothache which is chronic, hoping it responds to Tylenol. Spoke to his mom and dad last night; it went well. He has noticed very slightly that he has been able to communicate better without a mental pause. When he was on the Zyprexa he had very frequent mental pauses. He talked to his mom and dad yesterday and they sent him word search books.  He denies any side effects from medications and has been tolerating it well. His thought process is concrete. He is oriented to month and year. Future oriented (dental appt).   Principal Problem: Schizophrenia spectrum disorder with psychotic disorder type not yet determined ([place]) Diagnosis: Principal Problem:   Schizophrenia spectrum disorder with psychotic disorder type not yet determined ([place])  Total Time spent with patient: 20 minutes  Past Psychiatric History: None  Past Medical History: History reviewed. No pertinent past medical history. History reviewed. No pertinent surgical history. Family History: History reviewed. No pertinent family history. Family Psychiatric  History: Mother-Depression. Paternal side with mental illness but not sure what diagnosis they have.  Social History:  Social History   Substance and Sexual Activity  Alcohol Use None     Social History   Substance and Sexual Activity  Drug Use Not on file    Social History   Socioeconomic History   Marital status: Single    Spouse name: Not on file    Number of children: Not on file   Years of education: Not on file   Highest education level: Not on file  Occupational History   Not on file  Tobacco Use   Smoking status: Never   Smokeless tobacco: Never  Substance and Sexual Activity   Alcohol use: Not on file   Drug use: Not on file   Sexual activity: Not on file  Other Topics Concern   Not on file  Social History Narrative   Not on file   Social Determinants of Health   Financial Resource Strain: Not on file  Food Insecurity: Not on file  Transportation Needs: Not on file  Physical Activity: Not on file  Stress: Not on file  Social Connections: Not on file   Additional Social History:                         Sleep: Good  Appetite:  Good  Current Medications: Current Facility-Administered Medications  Medication Dose Route Frequency Provider Last Rate Last Admin   acetaminophen (TYLENOL) tablet 650 mg  650 mg Oral Q6H PRN Chalmers Guest, NP   650 mg at 03/09/21 1302   alum & mag hydroxide-simeth (MAALOX/MYLANTA) 200-200-20 MG/5ML suspension 30 mL  30 mL Oral Q4H PRN Chalmers Guest, NP       ARIPiprazole (ABILIFY) tablet 5 mg  5 mg Oral Daily Ethelene Hal, NP   5 mg at 03/09/21 0815   hydrOXYzine (ATARAX/VISTARIL) tablet 25  mg  25 mg Oral TID PRN Chalmers Guest, NP   25 mg at 03/08/21 2142   LORazepam (ATIVAN) tablet 1 mg  1 mg Oral Q6H PRN Chalmers Guest, NP       magnesium hydroxide (MILK OF MAGNESIA) suspension 30 mL  30 mL Oral Daily PRN Chalmers Guest, NP       OLANZapine zydis (ZYPREXA) disintegrating tablet 10 mg  10 mg Oral Q8H PRN Harlow Asa, MD       And   ziprasidone (GEODON) injection 20 mg  20 mg Intramuscular PRN Harlow Asa, MD       traZODone (DESYREL) tablet 50 mg  50 mg Oral QHS PRN Chalmers Guest, NP   50 mg at 03/08/21 2142    Lab Results:  Results for orders placed or performed during the hospital encounter of 03/07/21 (from the past 48 hour(s))  Hemoglobin A1c      Status: None   Collection Time: 03/08/21  6:44 AM  Result Value Ref Range   Hgb A1c MFr Bld 5.5 4.8 - 5.6 %    Comment: (NOTE)         Prediabetes: 5.7 - 6.4         Diabetes: >6.4         Glycemic control for adults with diabetes: <7.0    Mean Plasma Glucose 111 mg/dL    Comment: (NOTE) Performed At: [facility] [place], [place] [id number] Rush Farmer MD LE:[id number]   Lipid panel     Status: Abnormal   Collection Time: 03/08/21  6:44 AM  Result Value Ref Range   Cholesterol 159 0 - 200 mg/dL   Triglycerides 67 <150 mg/dL   HDL 43 >40 mg/dL   Total CHOL/HDL Ratio 3.7 RATIO   VLDL 13 0 - 40 mg/dL   LDL Cholesterol 103 (H) 0 - 99 mg/dL    Comment:        Total Cholesterol/HDL:CHD Risk Coronary Heart Disease Risk Table                     Men   Women  1/2 Average Risk   3.4   3.3  Average Risk       5.0   4.4  2 X Average Risk   9.6   7.1  3 X Average Risk  23.4   11.0        Use the calculated Patient Ratio above and the CHD Risk Table to determine the patient's CHD Risk.        ATP III CLASSIFICATION (LDL):  <100     mg/dL   Optimal  100-129  mg/dL   Near or Above                    Optimal  130-159  mg/dL   Borderline  160-189  mg/dL   High  >190     mg/dL   Very High Performed at [facility] [address] [place], [place] [address]   TSH     Status: None   Collection Time: 03/08/21  6:44 AM  Result Value Ref Range   TSH 1.052 0.350 - 4.500 uIU/mL    Comment: Performed by a 3rd Generation assay with a functional sensitivity of <=0.01 uIU/mL. Performed at [facility], [place] [address], [place], [place] [address]   RPR     Status: None   Collection Time:  03/08/21  6:21 PM  Result Value Ref Range   RPR Ser Ql NON REACTIVE NON REACTIVE    Comment: Performed at [facility], [place] [address], [place], [place] [address]  Sedimentation rate     Status: None   Collection Time: 03/08/21  6:21 PM  Result  Value Ref Range   Sed Rate 1 0 - 16 mm/hr    Comment: Performed at [facility], [place] [address], [place], [place] [address]  Vitamin B12     Status: None   Collection Time: 03/08/21  6:21 PM  Result Value Ref Range   Vitamin B-12 255 180 - 914 pg/mL    Comment: (NOTE) This assay is not validated for testing neonatal or myeloproliferative syndrome specimens for Vitamin B12 levels. Performed at [facility], [place] [address], [place], [place] [address]   HIV Antibody (routine testing w rflx)     Status: None   Collection Time: 03/08/21  6:21 PM  Result Value Ref Range   HIV Screen 4th Generation wRfx Non Reactive Non Reactive    Comment: Performed at [facility], [place] [address], [place], [place] [address]    Blood Alcohol level:  Lab Results  Component Value Date   ETH <10 65/46/5035    Metabolic Disorder Labs: Lab Results  Component Value Date   HGBA1C 5.5 03/08/2021   MPG 111 03/08/2021   No results found for: PROLACTIN Lab Results  Component Value Date   CHOL 159 03/08/2021   TRIG 67 03/08/2021   HDL 43 03/08/2021   CHOLHDL 3.7 03/08/2021   VLDL 13 03/08/2021   LDLCALC 103 (H) 03/08/2021    Physical Findings: AIMS: Facial and Oral Movements Muscles of Facial Expression: None, normal Lips and Perioral Area: None, normal Jaw: None, normal Tongue: None, normal,Extremity Movements Upper (arms, wrists, hands, fingers): None, normal Lower (legs, knees, ankles, toes): None, normal, Trunk Movements Neck, shoulders, hips: None, normal, Overall Severity Severity of abnormal movements (highest score from questions above): None, normal Incapacitation due to abnormal movements: None, normal Patient's awareness of abnormal movements (rate only patient's report): No Awareness, Dental Status Current problems with teeth and/or dentures?: No Does patient usually wear dentures?: No  CIWA:    COWS:     Musculoskeletal: Strength &  Muscle Tone: within normal limits Gait & Station: normal Patient leans: N/A  Psychiatric Specialty Exam:  Presentation  General Appearance: Appropriate for Environment; Casual; Fairly Groomed  Eye Contact:Good  Speech:Clear and Coherent  Speech Volume:Normal  Handedness:Right   Mood and Affect  Mood:Anxious  Affect:Constricted   Thought Process  Thought Processes:Coherent; Goal Directed; Linear  Descriptions of Associations:Intact  Orientation:Full (Time, Place and Person)  Thought Content:WDL  History of Schizophrenia/Schizoaffective disorder:No  Duration of Psychotic Symptoms:No data recorded Hallucinations:Hallucinations: None  Ideas of Reference:None  Suicidal Thoughts:Suicidal Thoughts: No  Homicidal Thoughts:Homicidal Thoughts: No   Sensorium  Memory:Immediate Fair; Recent Fair; Remote Poor  Judgment:Poor  Insight:Fair   Executive Functions  Concentration:Fair  Attention Span:Fair  [place] of Knowledge:Good  Language:Good   Psychomotor Activity  Psychomotor Activity:Psychomotor Activity: Normal   Assets  Assets:Communication Skills; Financial Resources/Insurance; Housing; Resilience; Social Support; Physical Health   Sleep  Sleep:Sleep: Fair Number of Hours of Sleep: 6.75    Physical Exam: Physical Exam Vitals and nursing note reviewed.  Constitutional:      Appearance: Normal appearance.  HENT:     Head: Normocephalic and atraumatic.  Pulmonary:  Effort: Pulmonary effort is normal.     Breath sounds: Normal breath sounds.  Neurological:     General: No focal deficit present.     Mental Status: He is alert and oriented to person, place, and time.   Review of Systems  Constitutional:  Negative for chills and fever.  HENT:  Negative for hearing loss.        Toothache  Eyes:  Negative for blurred vision.  Respiratory:  Negative for cough and shortness of breath.   Cardiovascular:  Negative for chest  pain.  Skin:  Negative for rash.  Neurological:  Negative for dizziness and headaches.  Psychiatric/Behavioral:  Negative for depression, hallucinations and suicidal ideas. The patient is not nervous/anxious.   Blood pressure 114/76, pulse 80, temperature 97.6 F (36.4 C), temperature source Oral, resp. rate 16, height _0  (1.88 m), weight 90.7 kg, SpO2 99 %. Body mass index is 25.68 kg/m.   Treatment Plan Summary:Justin Ponce is a 30 year old male who presented to [facility], voluntarily with his mother/guardian with complaints of adverse medication effects.  Labs reviewed- CT heard normal, HIV and RPR negative, Vitamin B12- 255 ESR-normal, Hb A1c 5.5, TSH 1.052, lipid panel shows cholesterol 159, LDL 103, Triglycerides 67, VLDL 13,  Electrolytes- wnl, Glucose 104. CBC- wnl Heavy metal, ANA, ceruloplasmin-pending  Plan Daily contact with patient to assess and evaluate symptoms and progress in treatment, Medication management    Unspecified schizophrenia spectrum and other psychotic d/o (r/o schizophreniform d/o, r/o schizoaffective d/o, r/o delusional d/o, r/o psychosis secondary to a general medical condition): -Continue Abilify 5 mg PO daily. Titrating up as tolerated. -Agitation protocol as needed with Zyprexa and Geodon.  Anxiety:  -Continue Vistaril 25 mg PO TID PRN -Ativan 1 mg PO every every 6 hours as needed for anxiety   Insomnia:  -Continue Trazodone 50 mg PO as needed   Other PRN's  -Acetaminophen 650 mg tablet PO every 6 hours as needed for pian -Maalox 199-579-00 mg/5ML suspension 61m every 4 hours as needed for indigestion -Milk of Magnesia 30 mL PO daily as needed for constipation   15 minute safety checks Encourage participation in the therapeutic milieu Discharge planning in progress  VArmando Reichert MD 03/09/2021, 3:50 PM

## 2021-03-09 NOTE — [facility] Group Notes (Signed)
[facility] Group Notes:  (Nursing/MHT/Case Management/Adjunct)  Date:  03/09/2021  Time:  8:47 PM  Type of Therapy:  Group Therapy  Participation Level:  Active  Participation Quality:  Appropriate  Affect:  Appropriate  Cognitive:  Appropriate  Insight:  Appropriate  Engagement in Group:  Developing/Improving  Modes of Intervention:  Discussion  Summary of Progress/Problems:  Lorita Officer 03/09/2021, 8:47 PM

## 2021-03-09 NOTE — Plan of Care (Signed)
Nurse discussed coping skills with patient.  

## 2021-03-09 NOTE — [facility] Group Notes (Signed)
Patient did not attend the Psycho-Ed group. 

## 2021-03-09 NOTE — [facility] Suicide Risk Assessment (Signed)
[facility] INPATIENT:  Family/Significant Other Suicide Prevention Education  Suicide Prevention Education:  Education Completed; Barbie Haggis, guardian and mother  (name of family member/significant other) has been identified by the patient as the family member/significant other with whom the patient will be residing, and identified as the person(s) who will aid the patient in the event of a mental health crisis (suicidal ideations/suicide attempt).  With written consent from the patient, the family member/significant other has been provided the following suicide prevention education, prior to the and/or following the discharge of the patient.  CSW spoke with Barbie Haggis, guardian and mother of patient.  Mother reports that she noticed a decline of mental health symptoms in the beginning of June. Patient was pacing and taling to himself. Patient was witnessed discussing delusions of involvement in the [employer] and speaking of memories that were not based in reality.  Patient had an episode of binge drinking alcohol and trying to kill himself in July.  Mother reports that patient has a way of making it seem that everything is okay on the outside and will keep his delusions a secret.  Delusions have been about [employer], [employer] and other top secret missions.  Patient was connected to [facility] after an admission to [facility], in which he went to a follow up psychiatry appointment and therapy appointment. Mom reports that patient will report that he has increased anger due to medications.  Mother reports that she worries what patient will do next.  The police have confiscated patient guns and other guns in the house have been locked in a safe that patient has no access to.  Mom reports that she will always be here to support son and he is able to return home upon discharge.    The suicide prevention education provided includes the following: Suicide risk factors Suicide prevention and interventions National Suicide Hotline  telephone number [facility] assessment telephone number [facility] Emergency Assistance 911 [facility] and/or Residential Mobile Crisis Unit telephone number  Request made of family/significant other to: Remove weapons (e.g., guns, rifles, knives), all items previously/currently identified as safety concern.   Remove drugs/medications (over-the-counter, prescriptions, illicit drugs), all items previously/currently identified as a safety concern.  The family member/significant other verbalizes understanding of the suicide prevention education information provided.  The family member/significant other agrees to remove the items of safety concern listed above.  Brynnlie Unterreiner E Yassin Scales 03/09/2021, 1:19 PM

## 2021-03-09 NOTE — Progress Notes (Signed)
   03/09/21 2106  Psych Admission Type (Psych Patients Only)  Admission Status Voluntary  Psychosocial Assessment  Patient Complaints None  Eye Contact Fair  Facial Expression Other (Comment) (appropriate)  Affect Appropriate to circumstance  Speech Logical/coherent  Interaction Assertive  Motor Activity Other (Comment) (WDL)  Appearance/Hygiene Unremarkable  Behavior Characteristics Cooperative;Appropriate to situation  Mood Pleasant;Preoccupied  Thought Process  Coherency WDL  Content Preoccupation  Delusions None reported or observed  Perception WDL  Hallucination None reported or observed  Judgment Poor  Confusion None  Danger to Self  Current suicidal ideation? Denies  Danger to Others  Danger to Others None reported or observed

## 2021-03-09 NOTE — Progress Notes (Signed)
Nursing Note: 0700-1900  D:   Goal for today: "Make it to the next day and then get out of here. I will need to take my meds."  Pt reports that he slept well last night and appetite is fair. Rates that anxiety, depression and hopelessness all 0/10 this am. Pt took first dose of Abilify 5mg  PO this am, with encouragement.  Pt calm and polite, shared that he loves video games and is looking forward to going home. A:  Pt. encouraged to verbalize needs and concerns, active listening and support provided.  Continued Q 15 minute safety checks. Pt prefers to not interact with peers. R:  Pt. is pleasant and cooperative.  Denies A/V hallucinations and is able to verbally contract for safety.    03/09/21 0800  Psych Admission Type (Psych Patients Only)  Admission Status Voluntary  Psychosocial Assessment  Patient Complaints None  Eye Contact Fair  Facial Expression Flat  Affect Anxious;Preoccupied  Speech Logical/coherent  Interaction Assertive  Motor Activity Other (Comment) (Unremarkable)  Appearance/Hygiene In scrubs  Behavior Characteristics Cooperative;Appropriate to situation  Mood Anxious;Pleasant;Preoccupied  Thought Process  Coherency Concrete thinking  Content Other (Comment) (Worried about taking medication.)  Delusions None reported or observed  Perception WDL  Hallucination None reported or observed  Judgment Poor  Confusion None  Danger to Self  Current suicidal ideation? Denies  Danger to Others  Danger to Others None reported or observed  [place] NOVEL CORONAVIRUS (COVID-19) DAILY CHECK-OFF SYMPTOMS - answer yes or no to each - every day NO YES  Have you had a fever in the past 24 hours?  Fever (Temp > 37.80C / 100F) X   Have you had any of these symptoms in the past 24 hours? New Cough  Sore Throat   Shortness of Breath  Difficulty Breathing  Unexplained Body Aches   X   Have you had any one of these symptoms in the past 24 hours not related to allergies?    Runny Nose  Nasal Congestion  Sneezing   X   If you have had runny nose, nasal congestion, sneezing in the past 24 hours, has it worsened?  X   EXPOSURES - check yes or no X   Have you traveled outside the state in the past 14 days?  X   Have you been in contact with someone with a confirmed diagnosis of COVID-19 or PUI in the past 14 days without wearing appropriate PPE?  X   Have you been living in the same home as a person with confirmed diagnosis of COVID-19 or a PUI (household contact)?    X   Have you been diagnosed with COVID-19?    X              What to do next: Answered NO to all: Answered YES to anything:   Proceed with unit schedule Follow the [facility] Inpatient Flowsheet.

## 2021-03-10 LAB — ANA: Anti Nuclear Antibody (ANA): NEGATIVE

## 2021-03-10 MED ORDER — ARIPIPRAZOLE 10 MG PO TABS
10.0000 mg | ORAL_TABLET | Freq: Every day | ORAL | Status: DC
  Administered 2021-03-11 – 2021-03-13 (×3): 10 mg via ORAL
  Filled 2021-03-10 (×6): qty 1

## 2021-03-10 MED ORDER — LIDOCAINE VISCOUS HCL 2 % MT SOLN
15.0000 mL | OROMUCOSAL | Status: DC | PRN
  Filled 2021-03-10: qty 15

## 2021-03-10 MED ORDER — QUETIAPINE FUMARATE 50 MG PO TABS
50.0000 mg | ORAL_TABLET | Freq: Once | ORAL | Status: DC | PRN
  Filled 2021-03-10: qty 1

## 2021-03-10 NOTE — Progress Notes (Signed)
   03/10/21 1200  Psych Admission Type (Psych Patients Only)  Admission Status Voluntary  Psychosocial Assessment  Patient Complaints Sleep disturbance  Eye Contact Fair  Facial Expression Other (Comment) (appropriate)  Affect Depressed  Speech Logical/coherent  Interaction Assertive  Motor Activity Other (Comment) (WDL)  Appearance/Hygiene Unremarkable  Behavior Characteristics Cooperative;Appropriate to situation  Mood Pleasant;Preoccupied  Thought Process  Coherency WDL  Content Preoccupation  Delusions None reported or observed  Perception WDL  Hallucination None reported or observed  Judgment Poor  Confusion None  Danger to Self  Current suicidal ideation? Denies  Danger to Others  Danger to Others None reported or observed

## 2021-03-10 NOTE — [facility] Group Notes (Signed)
Relaxation-did not attend 

## 2021-03-10 NOTE — Progress Notes (Addendum)
   03/10/21 2045  Psych Admission Type (Psych Patients Only)  Admission Status Voluntary  Psychosocial Assessment  Patient Complaints Sleep disturbance (roommate snores)  Eye Contact Fair  Facial Expression Anxious  Affect Depressed  Speech Logical/coherent  Interaction Assertive  Motor Activity Other (Comment) (wnl)  Appearance/Hygiene Unremarkable  Behavior Characteristics Cooperative;Appropriate to situation  Mood Pleasant  Thought Process  Coherency Circumstantial  Content Preoccupation  Delusions None reported or observed  Perception WDL  Hallucination None reported or observed  Judgment Poor  Confusion None  Danger to Self  Current suicidal ideation? Denies  Danger to Others  Danger to Others None reported or observed   Pt seen sitting in doorway to his room. Pt denies SI, HI, AVH. States he has pain 1/10 in his teeth. Food gets stuck in them and causes pain for which he has been prescribed lidocaine jelly 2% as needed. Pt states he is having trouble sleeping d/t a roommate who snores very loudly. "He has kept everyone up with the noise. I haven't been able to sleep. I just want some sleep." Pt asks for something else for sleep other than trazodone. When pt asked what he takes, he stated, "I'll take, what's that, a B52. You know the medicine they given you in your arm." Pt told this medication is not for sleep. "The guy I saw get one slept all night." Pt told that provider would be notified and other options for sleep meds placed.

## 2021-03-10 NOTE — Progress Notes (Signed)
Recreation Therapy Notes  Date:  9.2.22 Time: 0930 Location: [address] Dayroom  Group Topic: Stress Management  Goal Area(s) Addresses:  Patient will identify positive stress management techniques. Patient will identify benefits of using stress management post d/c.  Intervention: Stress Management  Activity :  LRT played Ponce meditation that focused on setting healthy boundaries even when it upsets the people around you.  Education:  Stress Management, Discharge Planning.   Education Outcome: Acknowledges Education  Clinical Observations/Feedback: Pt did not attend group session.    Justin Ponce, LRT/CTRS         Justin Ponce 03/10/2021 11:26 AM 
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
## 2021-03-10 NOTE — [facility] Group Notes (Signed)
Type of Therapy and Topic:  Group Therapy:  Self-Esteem   Participation Level:  Did not attend   Description of Group: This group addressed positive self-esteem. Patients were given a worksheet with a blank shield. Patients were asked what a shield is and when it is used. Patients were asked to list, draw, or write protective factors in the their lives on their shields. Patients discussed the words, ideas and drawings that they put on their shield. Patients were encouraged to have a daily reflection of positive characteristics/ protective factors.  Therapeutic Goals Patient will verbalize two of their positive qualities Patient will demonstrate insight but naming social supports in their lives Patient will verbalize their feelings when voicing positive self affirmations and when voicing positive affirmations of others Patients will discuss the potential positive impact on their wellness/recovery of focusing on positive traits of self and others.  Summary of Patient Progress:    Did not attend  

## 2021-03-10 NOTE — B[facility] Group Notes (Signed)
Patient did not contribute to group

## 2021-03-11 DIAGNOSIS — F29 Unspecified psychosis not due to a substance or known physiological condition: Secondary | ICD-10-CM | POA: Diagnosis not present

## 2021-03-11 DIAGNOSIS — R569 Unspecified convulsions: Secondary | ICD-10-CM | POA: Diagnosis not present

## 2021-03-11 LAB — HEAVY METALS, BLOOD
Arsenic: 1 ug/L (ref 0–9)
Lead: <1 ug/dL (ref 0–4)
Mercury: <1 ug/L (ref 0.0–14.9)

## 2021-03-11 LAB — RESP PANEL BY RT-PCR (FLU A&B, COVID) ARPGX2
Influenza A by PCR: NEGATIVE
Influenza B by PCR: NEGATIVE
SARS Coronavirus 2 by RT PCR: NEGATIVE

## 2021-03-11 NOTE — B[facility] Group Notes (Signed)
[facility] Group Notes:  (Nursing/MHT/Case Management/Adjunct)  Date:  03/11/2021  Time:  9:11 AM  Type of Therapy:  GoalGroup Therapy  Participation Level:  Active  Participation Quality:  Appropriate  Affect:  Anxious and Appropriate  Cognitive:  Oriented  Insight:  Good  Engagement in Group:  Distracting and Engaged  Modes of Intervention:  Clarification  Summary of Progress/Problems: Pt stated their gaol is to get some sleep [place] 03/11/2021, 9:11 AM
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
## 2021-03-11 NOTE — Progress Notes (Signed)
Progress note    03/11/21 0742  Psych Admission Type (Psych Patients Only)  Admission Status Voluntary  Psychosocial Assessment  Patient Complaints Anxiety;Sleep disturbance  Eye Contact Fair  Facial Expression Anxious;Pensive  Affect Anxious;Appropriate to circumstance;Preoccupied  Speech Logical/coherent  Interaction Assertive  Motor Activity Slow  Appearance/Hygiene In scrubs  Behavior Characteristics Cooperative;Appropriate to situation;Anxious  Mood Anxious;Preoccupied;Pleasant  Thought Process  Coherency Concrete thinking  Content Hypochondria  Delusions Paranoid  Perception WDL  Hallucination None reported or observed  Judgment Poor  Confusion None  Danger to Self  Current suicidal ideation? Denies  Danger to Others  Danger to Others None reported or observed
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
## 2021-03-11 NOTE — Plan of Care (Signed)
  Problem: Activity: Goal: Will identify at least one activity in which they can participate Outcome: Progressing   Problem: Coping: Goal: Ability to identify and develop effective coping behavior will improve Outcome: Progressing Goal: Demonstration of participation in decision-making regarding own care will improve Outcome: Progressing

## 2021-03-11 NOTE — Group Note (Signed)
Clinical Social Work Note  No group could be held today due to a COVID-19 outbreak on the unit.  An educational packet about anger management was provided to the patient to work on individually.  Nikodem Leadbetter Grossman-Orr, LCSW 03/11/2021, 8:54 AM    

## 2021-03-12 DIAGNOSIS — F29 Unspecified psychosis not due to a substance or known physiological condition: Secondary | ICD-10-CM | POA: Diagnosis not present

## 2021-03-12 DIAGNOSIS — R569 Unspecified convulsions: Secondary | ICD-10-CM | POA: Diagnosis not present

## 2021-03-12 LAB — URINALYSIS, ROUTINE W REFLEX MICROSCOPIC
Bacteria, UA: NONE SEEN
Bilirubin Urine: NEGATIVE
Glucose, UA: NEGATIVE mg/dL
Ketones, ur: NEGATIVE mg/dL
Leukocytes,Ua: NEGATIVE
Nitrite: NEGATIVE
Protein, ur: NEGATIVE mg/dL
Specific Gravity, Urine: 1.01 (ref 1.005–1.030)
pH: 7 (ref 5.0–8.0)

## 2021-03-12 LAB — CBC WITH DIFFERENTIAL/PLATELET
Abs Immature Granulocytes: 0.08 10*3/uL — ABNORMAL HIGH (ref 0.00–0.07)
Basophils Absolute: 0.1 10*3/uL (ref 0.0–0.1)
Basophils Relative: 0 %
Eosinophils Absolute: 0.1 10*3/uL (ref 0.0–0.5)
Eosinophils Relative: 1 %
HCT: 45.7 % (ref 39.0–52.0)
Hemoglobin: 15.7 g/dL (ref 13.0–17.0)
Immature Granulocytes: 1 %
Lymphocytes Relative: 9 %
Lymphs Abs: 1.1 10*3/uL (ref 0.7–4.0)
MCH: 29.8 pg (ref 26.0–34.0)
MCHC: 34.4 g/dL (ref 30.0–36.0)
MCV: 86.7 fL (ref 80.0–100.0)
Monocytes Absolute: 0.9 10*3/uL (ref 0.1–1.0)
Monocytes Relative: 8 %
Neutro Abs: 9.6 10*3/uL — ABNORMAL HIGH (ref 1.7–7.7)
Neutrophils Relative %: 81 %
Platelets: 256 10*3/uL (ref 150–400)
RBC: 5.27 MIL/uL (ref 4.22–5.81)
RDW: 14 % (ref 11.5–15.5)
WBC: 11.8 10*3/uL — ABNORMAL HIGH (ref 4.0–10.5)
nRBC: 0 % (ref 0.0–0.2)

## 2021-03-12 LAB — COMPREHENSIVE METABOLIC PANEL
ALT: 21 U/L (ref 0–44)
AST: 20 U/L (ref 15–41)
Albumin: 4.6 g/dL (ref 3.5–5.0)
Alkaline Phosphatase: 71 U/L (ref 38–126)
Anion gap: 8 (ref 5–15)
BUN: 12 mg/dL (ref 6–20)
CO2: 28 mmol/L (ref 22–32)
Calcium: 9.4 mg/dL (ref 8.9–10.3)
Chloride: 103 mmol/L (ref 98–111)
Creatinine, Ser: 1.09 mg/dL (ref 0.61–1.24)
GFR, Estimated: >60 mL/min (ref >60)
Glucose, Bld: 90 mg/dL (ref 70–99)
Potassium: 4.3 mmol/L (ref 3.5–5.1)
Sodium: 139 mmol/L (ref 135–145)
Total Bilirubin: 0.6 mg/dL (ref 0.3–1.2)
Total Protein: 7.3 g/dL (ref 6.5–8.1)

## 2021-03-12 LAB — GLUCOSE, CAPILLARY: Glucose-Capillary: 88 mg/dL (ref 70–99)

## 2021-03-12 LAB — RAPID URINE DRUG SCREEN, HOSP PERFORMED
Amphetamines: NOT DETECTED
Barbiturates: NOT DETECTED
Benzodiazepines: NOT DETECTED
Cocaine: NOT DETECTED
Opiates: NOT DETECTED
Tetrahydrocannabinol: NOT DETECTED

## 2021-03-12 LAB — CBG MONITORING, ED: Glucose-Capillary: 95 mg/dL (ref 70–99)

## 2021-03-12 MED ORDER — MELATONIN 5 MG PO TABS
5.0000 mg | ORAL_TABLET | Freq: Every day | ORAL | Status: DC
  Filled 2021-03-12 (×2): qty 1

## 2021-03-12 MED ORDER — TRAZODONE HCL 50 MG PO TABS: 50.0000 mg | ORAL_TABLET | Freq: Every evening | ORAL | Status: DC | PRN

## 2021-03-12 MED ORDER — MELATONIN 5 MG PO TABS
10.0000 mg | ORAL_TABLET | Freq: Every day | ORAL | Status: DC
  Administered 2021-03-12 – 2021-03-16 (×5): 10 mg via ORAL
  Filled 2021-03-12: qty 2
  Filled 2021-03-12: qty 14
  Filled 2021-03-12 (×5): qty 2
  Filled 2021-03-12: qty 14
  Filled 2021-03-12: qty 2

## 2021-03-12 MED ORDER — QUETIAPINE FUMARATE 50 MG PO TABS
50.0000 mg | ORAL_TABLET | Freq: Every day | ORAL | Status: DC
  Filled 2021-03-12: qty 1

## 2021-03-12 MED ORDER — LEVETIRACETAM 500 MG PO TABS: 500.0000 mg | ORAL_TABLET | Freq: Two times a day (BID) | ORAL | 0 refills | Status: DC

## 2021-03-12 MED ORDER — LEVETIRACETAM 500 MG PO TABS
500.0000 mg | ORAL_TABLET | Freq: Two times a day (BID) | ORAL | Status: DC
  Administered 2021-03-12 – 2021-03-13 (×3): 500 mg via ORAL
  Filled 2021-03-12 (×6): qty 1
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
## 2021-03-12 NOTE — Progress Notes (Signed)
Writer received in report tonight that in treatment team meeting Dr. Mason Jim reported that she was not informed of Reilly's episode the prior morning and  asked why he had not been sent out to the ED. Writer did inform Dr Mason Jim as soon as she came onto the unit as to what had happened when she came onto the unit first thing and she replied "Do we need to send him out?"  She was informed that Sherald Hess. NP was down the hall assesssing the patient and shje replied that she would be on 400 hall and proceeded to see her patients on that hall.

## 2021-03-12 NOTE — ED Triage Notes (Signed)
Pt coming from [facility] with c/o a seizure that occurred that morning at 7am. Pt was cleared by NP but morning doc wanted to get patient cleared. Patient A&O x4 and has no complaints. All vitals WDL
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
## 2021-03-12 NOTE — Progress Notes (Signed)
Pt since returning from [facility], is alert and oriented to person, place, time and situation. Pt has been calm, cooperative, pleasant, no seizure activity. Pt is A&Ox4, denies SI/HI/AVH. Pt has been medication compliant. Will continue to monitor pt per Q15 minute face checks and monitor for safety and progress.

## 2021-03-12 NOTE — Progress Notes (Addendum)
Pt is alert and oriented to person, place, time and situation. Pt is ambulatory, steady gait, is on a 1:1 with staff for safety per orders.  Pt denies pain and discomfort. Pt is ambulatory, gait is steady. Pt reports, "I don't think I had a seizure, I feel like I just got up too fast and passed out." Pt was asked about last seizure activity, pt states, "I don't think I have any. The last time I passed out my nutrition was not good and I think it was low sugar." This was reported to MD.  Per verbal orders at 08:55am, pt to be seen at ED for evaluation. Transportation is being arranged at this time.Charge, RN also aware and was given these verbal orders at the same time. EMT arrived for pick up at 0905. Pt reports, "I don't want to go to ED I was there for 10 hours before." Pt encouraged to go and agreed. Pt going with MHT who is pt's 1:1.

## 2021-03-12 NOTE — Progress Notes (Signed)
Writer spoke with patient 1:1 and he reports that his day has been okay but he doesn't feel that his doctor is listening to him. He reports that he has been having difficulty sleeping d/t room mates snoring. He was informed of his sleep medication change to melatonin 5 mg and he reported that it was not enough and would not work. He requested 10 mg melatonin to see if it will work. An order was received for this amount.He also reported that he informed the doctor that since taking abilify 10 mg he has been c/o headaches but feels that nothing is being done so he has been taking tylenol to help with the headache. Writer did inform him that he could move in with patient in 300-2 but he declined. Writer encouraged him to continue to talk with his doctor about his concerns with his medications. Support given and safety maintained on unit with 15 min. checks.

## 2021-03-12 NOTE — Discharge Instructions (Signed)
Take Keppra twice daily for seizure prevention until you are able to follow-up outpatient with neurologist. Return if worse.

## 2021-03-12 NOTE — [facility] Group Notes (Signed)
Adult Psychoeducational Group Note  Date:  03/12/2021 Time:  4:49 PM  Group Topic/Focus:  Self Care:   The focus of this group is to help patients understand the importance of self-care in order to improve or restore emotional, physical, spiritual, interpersonal, and financial health.  Participation Level:  Active  Participation Quality:  Appropriate  Affect:  Appropriate  Cognitive:  Appropriate  Insight: Appropriate  Engagement in Group:  Improving  Modes of Intervention:  Confrontation  Additional Comments:    Donell Beers 03/12/2021, 4:49 PM
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
## 2021-03-12 NOTE — ED Provider Notes (Signed)
[facility] Provider Note   CSN: [id number] Arrival date & time: 03/12/21  7341     History Chief Complaint  Patient presents with   schophrenia   Seizures    Justin Ponce is a 30 y.o. male.   Seizures  Patient with his history of schizophrenia currently at behavioral health admission presents with seizure-like activity this morning at 6:45 AM.  Seizure started acutely, it was witnessed.  He had 1 episode of urinary incontinence and the seizure lasted for about 45 seconds.  Patient did not hit his head or bite his tongue, denies any syncopal event.  He did have a slight postictal period after the fact, he is not having any fevers.  Denies any urinary symptoms other than the one episode of urine incontinence.  No chest pain or shortness of breath.  Patient is taking Abilify, 2 mg.  Patient endorses having headaches due to the medication.  He has had multiple recent medication changes during his hospitalization with behavioral health.  History reviewed. No pertinent past medical history.  Patient Active Problem List   Diagnosis Date Noted   Schizophrenia spectrum disorder with psychotic disorder type not yet determined (HCC) 03/07/2021    History reviewed. No pertinent surgical history.     History reviewed. No pertinent family history.  Social History   Tobacco Use   Smoking status: Never   Smokeless tobacco: Never    Home Medications Prior to Admission medications   Medication Sig Start Date End Date Taking? Authorizing Provider  benztropine (COGENTIN) 0.5 MG tablet Take 0.5 mg by mouth 2 (two) times daily. 02/28/21   [provider]  risperiDONE (RISPERDAL) 1 MG tablet Take 1 mg by mouth 2 (two) times daily. 02/28/21   [provider]    Allergies    Amoxicillin and Penicillins  Review of Systems   Review of Systems  Constitutional:  Negative for fever.  Respiratory:  Negative for shortness of breath.    Cardiovascular:  Negative for chest pain.  Gastrointestinal:  Negative for nausea and vomiting.  Neurological:  Positive for seizures. Negative for syncope and weakness.   Physical Exam Updated Vital Signs BP 136/75 (BP Location: Left Arm)   Pulse 69   Temp 98.1 F (36.7 C) (Oral)   Resp 16   Ht 6\' 2"  (1.88 m)   Wt 90.7 kg   SpO2 100%   BMI 25.68 kg/m   Physical Exam Vitals and nursing note reviewed. Exam conducted with a chaperone present.  Constitutional:      General: He is not in acute distress.    Appearance: Normal appearance.     Comments: Patient is alert and aware, not in any acute distress.  Patient is not postictal at this time.  Not actively seizing.  HENT:     Head: Normocephalic and atraumatic.     Mouth/Throat:     Comments: Tongue is atraumatic Eyes:     General: No scleral icterus.       Right eye: No discharge.        Left eye: No discharge.     Extraocular Movements: Extraocular movements intact.     Pupils: Pupils are equal, round, and reactive to light.  Cardiovascular:     Rate and Rhythm: Normal rate and regular rhythm.     Pulses: Normal pulses.     Heart sounds: Normal heart sounds. No murmur heard.   No friction rub. No gallop.  Pulmonary:     Effort:  Pulmonary effort is normal. No respiratory distress.     Breath sounds: Normal breath sounds.  Abdominal:     General: Abdomen is flat. Bowel sounds are normal. There is no distension.     Palpations: Abdomen is soft.     Tenderness: There is no abdominal tenderness.  Skin:    General: Skin is warm and dry.     Coloration: Skin is not jaundiced.  Neurological:     Mental Status: He is alert. Mental status is at baseline.     Coordination: Coordination normal.     Comments: Patient is answering questions and following commands appropriately.  No dysarthria.  Cranial nerves II through XII are grossly intact, grip strength is equal bilaterally.  Patient able to raise both legs, plantar flexion  and dorsiflexion 5/5 against resistance.   ED Results / Procedures / Treatments   Labs (all labs ordered are listed, but only abnormal results are displayed) Labs Reviewed  LIPID PANEL - Abnormal; Notable for the following components:      Result Value   LDL Cholesterol 103 (*)    All other components within normal limits  RESP PANEL BY RT-PCR (FLU A&B, COVID) ARPGX2  HEMOGLOBIN A1C  TSH  HEPATITIS PANEL, ACUTE  RPR  SEDIMENTATION RATE  VITAMIN B12  ANA  HEAVY METALS, BLOOD  HIV ANTIBODY (ROUTINE TESTING W REFLEX)  GLUCOSE, CAPILLARY  CERULOPLASMIN  COMPREHENSIVE METABOLIC PANEL  CBC WITH DIFFERENTIAL/PLATELET  URINALYSIS, ROUTINE W REFLEX MICROSCOPIC  RAPID URINE DRUG SCREEN, HOSP PERFORMED  CBG MONITORING, ED    EKG None  Radiology No results found.  Procedures Procedures   Medications Ordered in ED Medications  acetaminophen (TYLENOL) tablet 650 mg (650 mg Oral Given 03/11/21 1152)  alum & mag hydroxide-simeth (MAALOX/MYLANTA) 200-200-20 MG/5ML suspension 30 mL (has no administration in time range)  magnesium hydroxide (MILK OF MAGNESIA) suspension 30 mL (has no administration in time range)  hydrOXYzine (ATARAX/VISTARIL) tablet 25 mg (25 mg Oral Given 03/11/21 2117)  traZODone (DESYREL) tablet 50 mg (50 mg Oral Given 03/11/21 2117)  ARIPiprazole (ABILIFY) tablet 10 mg (10 mg Oral Given 03/12/21 0858)  lidocaine (XYLOCAINE) 2 % viscous mouth solution 15 mL (has no administration in time range)    ED Course  I have reviewed the triage vital signs and the nursing notes.  Pertinent labs & imaging results that were available during my care of the patient were reviewed by me and considered in my medical decision making (see chart for details).    MDM Rules/Calculators/A&P                           Patient vitals are stable, he did have a reported seizure.  He does have history of same in 2009, is not on preventative medicine since then.  There was and it was a  witnessed seizure by the nursing staff at [facility] as reported by the nursing notes, patient has not had any episodes since then.  His neuro exam is reassuring, no focal deficits.  He has been starting a lot of new medications in the last week, but none of these really are associated with any seizure episodes.  Will work-up broadly.  No signs of urinary tract infection, patient just had a CT head do not think would be helpful in this case.  He does have a mild leukocytosis which I suspect is reactionary, no illicit drug use.  Patient does have a history of the same,  will start the patient on Keppra as preventative.  We will also have him follow-up with neurology on an outpatient setting for additional work-up for the seizure-like activity.  Discussed this with the patient who is adamant he did not have a seizure just a syncopal episode because he was feeling tired and lightheaded.    Patient is medically cleared at this time to her back to behavioral health.  Final Clinical Impression(s) / ED Diagnoses Final diagnoses:  None    Rx / DC Orders ED Discharge Orders     None        Theron Arista, PA-C 03/12/21 1224    Sloan Leiter, DO 03/12/21 1809

## 2021-03-13 ENCOUNTER — Encounter (HOSPITAL_COMMUNITY): Payer: Self-pay

## 2021-03-13 LAB — RESP PANEL BY RT-PCR (FLU A&B, COVID) ARPGX2
Influenza A by PCR: NEGATIVE
Influenza B by PCR: NEGATIVE
SARS Coronavirus 2 by RT PCR: NEGATIVE

## 2021-03-13 MED ORDER — DIVALPROEX SODIUM 500 MG PO DR TAB
750.0000 mg | DELAYED_RELEASE_TABLET | Freq: Two times a day (BID) | ORAL | Status: DC
  Filled 2021-03-13 (×3): qty 1

## 2021-03-13 MED ORDER — ARIPIPRAZOLE 15 MG PO TABS
7.5000 mg | ORAL_TABLET | Freq: Every day | ORAL | Status: DC
  Administered 2021-03-14 – 2021-03-17 (×4): 7.5 mg via ORAL
  Filled 2021-03-13 (×2): qty 1
  Filled 2021-03-13: qty 7
  Filled 2021-03-13 (×2): qty 1
  Filled 2021-03-13: qty 7
  Filled 2021-03-13: qty 1

## 2021-03-13 MED ORDER — DIVALPROEX SODIUM 125 MG PO CSDR
750.0000 mg | DELAYED_RELEASE_CAPSULE | Freq: Two times a day (BID) | ORAL | Status: DC
  Administered 2021-03-13 – 2021-03-17 (×8): 750 mg via ORAL
  Filled 2021-03-13 (×15): qty 6

## 2021-03-13 NOTE — Progress Notes (Signed)
Justin Ponce was pleasant and cooperative.  He denied any SI/HI or AVH.  Reviewed new medication (Depakote DR) with him and he stated that he would not be able to swallow the pills.  "I chew my pills."  Notified Nira Conn NP of pts concerns.  Order for Depakote sprinkles obtained.  He was able to take in pudding without difficulty.  He denied any physical complaints.  He is currently resting with his eyes closed and appears to be asleep.   03/13/21 2055  Psych Admission Type (Psych Patients Only)  Admission Status Voluntary  Psychosocial Assessment  Patient Complaints Anxiety;Worrying  Eye Contact Fair  Facial Expression Flat  Affect Anxious;Appropriate to circumstance  Speech Logical/coherent  Interaction Assertive  Motor Activity Slow  Appearance/Hygiene Unremarkable  Behavior Characteristics Cooperative;Appropriate to situation  Mood Anxious;Pleasant  Thought Process  Coherency WDL  Content WDL  Delusions None reported or observed  Perception WDL  Hallucination None reported or observed  Judgment Poor  Confusion None  Danger to Self  Current suicidal ideation? Denies  Danger to Others  Danger to Others None reported or observed

## 2021-03-13 NOTE — Progress Notes (Signed)
[facility] MD Progress Note  03/13/2021 1:43 PM Justin Ponce  MRN:  [id number] Subjective: Patient states that he has had a headache for the past 3 days and wonders if it is due to the abilify. We did discuss the seizure over the weekend - he states that it was not a seizure but rather orthostasis (got up quickly on a full bladder and passed out). States he was on a sleep medicine he wasn't supposed to be on (zyprexa). He did get zyprexa 10 mg for agitation the night before this event. States he has not had seizures in the past but has passed out in the past. Has been to the doctor, had brain scans, etc and does not believe he has had seizures. His appetite is OK - had brownie with coffee this AM. Appetite has gone down this hospitalization which he feels is due to less physical activity. Mood is a little annoyed he is still here - no HI/SI, no hallucinations. Has noticed he has been stumbling the last 2 days - thinks this is because of the abilify 10 mg. Worrying that he is peeing too much but has been drinking a lot of juice lately (urine has been clear). Still feeling like he needs to pee every 30-45 min but plans to start drinking less juice today. No pain with urination. 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 He states [employer] is [employer]. Has tried to get in touch with them here but the number is blocked. Has not been drinking caffeine outsie the hospital. Would be willing to compromise and get 7.5 mg. He plans to stop taking the seizure medicine outside the hospital. Patient adamant that there were no convulsions.  Attending called Stormy's Mom Zavadil,Lisa (Mother) _0 [phone] explained to her about switching Keppra to Depakote due to [place] psychiatric side effects. Mom verbalizes understanding.   Principal Problem: Schizophrenia spectrum disorder with psychotic disorder type not yet determined ([place]) Diagnosis: Principal Problem:   Schizophrenia spectrum disorder with  psychotic disorder type not yet determined ([place]) Active Problems:   Seizure ([place])  Total Time spent with patient: 20 minutes  Past Psychiatric History: None  Past Medical History: History reviewed. No pertinent past medical history. History reviewed. No pertinent surgical history. Family History: History reviewed. No pertinent family history. Family Psychiatric  History: Mother-Depression. Paternal side with mental illness but not sure what diagnosis they have.  Social History:  Social History   Substance and Sexual Activity  Alcohol Use None     Social History   Substance and Sexual Activity  Drug Use Not on file    Social History   Socioeconomic History   Marital status: Single    Spouse name: Not on file   Number of children: Not on file   Years of education: Not on file   Highest education level: Not on file  Occupational History   Not on file  Tobacco Use   Smoking status: Never   Smokeless tobacco: Never  Substance and Sexual Activity   Alcohol use: Not on file   Drug use: Not on file   Sexual activity: Not on file  Other Topics Concern   Not on file  Social History Narrative   Not on file   Social Determinants of Health   Financial Resource Strain: Not on file  Food Insecurity: Not on file  Transportation Needs: Not on file  Physical Activity: Not on file  Stress: Not on file  Social Connections: Not on file   Additional Social History:     Sleep: Fair  Appetite:  Good  Current Medications: Current Facility-Administered Medications  Medication Dose Route Frequency Provider Last Rate Last Admin   acetaminophen (TYLENOL) tablet 650 mg  650 mg Oral Q6H PRN Ethelene Hal, NP   650 mg at 03/13/21 1323   alum & mag hydroxide-simeth (MAALOX/MYLANTA) 200-200-20 MG/5ML suspension 30 mL  30 mL Oral Q4H PRN Ethelene Hal, NP       ARIPiprazole (ABILIFY) tablet 10 mg  10 mg Oral Daily Ethelene Hal, NP   10 mg at 03/13/21 0749    levETIRAcetam (KEPPRA) tablet 500 mg  500 mg Oral BID Ethelene Hal, NP   500 mg at 03/13/21 0749   lidocaine (XYLOCAINE) 2 % viscous mouth solution 15 mL  15 mL Mouth/Throat Q3H PRN Ethelene Hal, NP       magnesium hydroxide (MILK OF MAGNESIA) suspension 30 mL  30 mL Oral Daily PRN Ethelene Hal, NP       melatonin tablet 10 mg  10 mg Oral QHS Nwoko, Uchenna E, PA   10 mg at 03/12/21 2136   traZODone (DESYREL) tablet 50 mg  50 mg Oral QHS PRN Malachy Mood, PA        Lab Results:  Results for orders placed or performed during the hospital encounter of 03/07/21 (from the past 48 hour(s))  Resp Panel by RT-PCR (Flu A&B, Covid) Nasopharyngeal Swab     Status: None   Collection Time: 03/11/21  3:39 PM   Specimen: Nasopharyngeal Swab; Nasopharyngeal(NP) swabs in vial transport medium  Result Value Ref Range   SARS Coronavirus 2 by RT PCR NEGATIVE NEGATIVE    Comment: (NOTE) SARS-CoV-2 target nucleic acids are NOT DETECTED.  The SARS-CoV-2 RNA is generally detectable in upper respiratory specimens during the acute phase of infection. The lowest concentration of SARS-CoV-2 viral copies this assay can detect is 138 copies/mL. A negative result does not preclude SARS-Cov-2 infection and should not be used as the sole basis for treatment or other patient management decisions. A negative result may occur with  improper specimen collection/handling, submission of specimen other than nasopharyngeal swab, presence of viral mutation(s) within the areas targeted by this assay, and inadequate number of viral copies(<138 copies/mL). A negative result must be combined with clinical observations, patient history, and epidemiological information. The expected result is Negative.  Fact Sheet for Patients:  EntrepreneurPulse.com.au  Fact Sheet for Healthcare Providers:  IncredibleEmployment.be  This test is no t yet approved or cleared by  the [place] FDA and  has been authorized for detection and/or diagnosis of SARS-CoV-2 by FDA under an Emergency Use Authorization (EUA). This EUA will remain  in effect (meaning this test can be used) for the duration of the COVID-19 declaration under Section 564(b)(1) of the Act, 21 U.S.C.section 360bbb-3(b)(1), unless the authorization is terminated  or revoked sooner.       Influenza A by PCR NEGATIVE NEGATIVE   Influenza B by PCR NEGATIVE NEGATIVE    Comment: (NOTE) The Xpert Xpress SARS-CoV-2/FLU/RSV plus assay is intended as an aid in the diagnosis of influenza from Nasopharyngeal swab specimens and should not be used as a sole basis for treatment. Nasal washings and aspirates are unacceptable for Xpert Xpress SARS-CoV-2/FLU/RSV testing.  Fact Sheet for Patients: EntrepreneurPulse.com.au  Fact Sheet for Healthcare Providers: IncredibleEmployment.be  This test is not yet approved or cleared by the [place]  FDA and has been authorized for detection and/or diagnosis of SARS-CoV-2 by FDA under an Emergency Use Authorization (EUA). This EUA will remain in effect (meaning this test can be used) for the duration of the COVID-19 declaration under Section 564(b)(1) of the Act, 21 U.S.C. section 360bbb-3(b)(1), unless the authorization is terminated or revoked.  Performed at [facility], [place] [address] [place], [place] [address]   Glucose, capillary     Status: None   Collection Time: 03/12/21  6:45 AM  Result Value Ref Range   Glucose-Capillary 88 70 - 99 mg/dL    Comment: Glucose reference range applies only to samples taken after fasting for at least 8 hours.   Comment 1 Notify RN    Comment 2 Document in Chart   CBG monitoring, ED     Status: None   Collection Time: 03/12/21 10:16 AM  Result Value Ref Range   Glucose-Capillary 95 70 - 99 mg/dL    Comment: Glucose reference range applies only to samples  taken after fasting for at least 8 hours.  Comprehensive metabolic panel     Status: None   Collection Time: 03/12/21 10:22 AM  Result Value Ref Range   Sodium 139 135 - 145 mmol/L   Potassium 4.3 3.5 - 5.1 mmol/L   Chloride 103 98 - 111 mmol/L   CO2 28 22 - 32 mmol/L   Glucose, Bld 90 70 - 99 mg/dL    Comment: Glucose reference range applies only to samples taken after fasting for at least 8 hours.   BUN 12 6 - 20 mg/dL   Creatinine, Ser 1.09 0.61 - 1.24 mg/dL   Calcium 9.4 8.9 - 10.3 mg/dL   Total Protein 7.3 6.5 - 8.1 g/dL   Albumin 4.6 3.5 - 5.0 g/dL   AST 20 15 - 41 U/L   ALT 21 0 - 44 U/L   Alkaline Phosphatase 71 38 - 126 U/L   Total Bilirubin 0.6 0.3 - 1.2 mg/dL   GFR, Estimated >60 >60 mL/min    Comment: (NOTE) Calculated using the CKD-EPI Creatinine Equation (2021)    Anion gap 8 5 - 15    Comment: Performed at [facility], [place] [address], [place], [place] [address]  CBC with Differential/Platelet     Status: Abnormal   Collection Time: 03/12/21 10:22 AM  Result Value Ref Range   WBC 11.8 (H) 4.0 - 10.5 K/uL   RBC 5.27 4.22 - 5.81 MIL/uL   Hemoglobin 15.7 13.0 - 17.0 g/dL   HCT 45.7 39.0 - 52.0 %   MCV 86.7 80.0 - 100.0 fL   MCH 29.8 26.0 - 34.0 pg   MCHC 34.4 30.0 - 36.0 g/dL   RDW 14.0 11.5 - 15.5 %   Platelets 256 150 - 400 K/uL   nRBC 0.0 0.0 - 0.2 %   Neutrophils Relative % 81 %   Neutro Abs 9.6 (H) 1.7 - 7.7 K/uL   Lymphocytes Relative 9 %   Lymphs Abs 1.1 0.7 - 4.0 K/uL   Monocytes Relative 8 %   Monocytes Absolute 0.9 0.1 - 1.0 K/uL   Eosinophils Relative 1 %   Eosinophils Absolute 0.1 0.0 - 0.5 K/uL   Basophils Relative 0 %   Basophils Absolute 0.1 0.0 - 0.1 K/uL   Immature Granulocytes 1 %   Abs Immature Granulocytes 0.08 (H) 0.00 - 0.07 K/uL    Comment: Performed at [facility], [place] [address] [place], [place]  [address]  Urinalysis, Routine w reflex microscopic Urine, Clean Catch     Status: Abnormal    Collection Time: 03/12/21 10:22 AM  Result Value Ref Range   Color, Urine YELLOW (A) YELLOW   APPearance CLEAR (A) CLEAR   Specific Gravity, Urine 1.010 1.005 - 1.030   pH 7.0 5.0 - 8.0   Glucose, UA NEGATIVE NEGATIVE mg/dL   Hgb urine dipstick TRACE (A) NEGATIVE   Bilirubin Urine NEGATIVE NEGATIVE   Ketones, ur NEGATIVE NEGATIVE mg/dL   Protein, ur NEGATIVE NEGATIVE mg/dL   Nitrite NEGATIVE NEGATIVE   Leukocytes,Ua NEGATIVE NEGATIVE   RBC / HPF 0-5 0 - 5 RBC/hpf   WBC, UA 0-5 0 - 5 WBC/hpf   Bacteria, UA NONE SEEN NONE SEEN   Squamous Epithelial / LPF 0-5 0 - 5    Comment: Performed at [facility], [place] [address], [place], [place] [address]  Rapid urine drug screen (hospital performed)     Status: None   Collection Time: 03/12/21 10:22 AM  Result Value Ref Range   Opiates NONE DETECTED NONE DETECTED   Cocaine NONE DETECTED NONE DETECTED   Benzodiazepines NONE DETECTED NONE DETECTED   Amphetamines NONE DETECTED NONE DETECTED   Tetrahydrocannabinol NONE DETECTED NONE DETECTED   Barbiturates NONE DETECTED NONE DETECTED    Comment: (NOTE) DRUG SCREEN FOR MEDICAL PURPOSES ONLY.  IF CONFIRMATION IS NEEDED FOR ANY PURPOSE, NOTIFY LAB WITHIN 5 DAYS.  LOWEST DETECTABLE LIMITS FOR URINE DRUG SCREEN Drug Class                     Cutoff (ng/mL) Amphetamine and metabolites    1000 Barbiturate and metabolites    200 Benzodiazepine                 341 Tricyclics and metabolites     300 Opiates and metabolites        300 Cocaine and metabolites        300 THC                            50 Performed at [facility], [place] [address], [place], [place] [address]   Resp Panel by RT-PCR (Flu A&B, Covid) Nasopharyngeal Swab     Status: None   Collection Time: 03/13/21  4:25 AM   Specimen: Nasopharyngeal Swab; Nasopharyngeal(NP) swabs in vial transport medium  Result Value Ref Range   SARS Coronavirus 2 by RT PCR NEGATIVE NEGATIVE    Comment:  (NOTE) SARS-CoV-2 target nucleic acids are NOT DETECTED.  The SARS-CoV-2 RNA is generally detectable in upper respiratory specimens during the acute phase of infection. The lowest concentration of SARS-CoV-2 viral copies this assay can detect is 138 copies/mL. A negative result does not preclude SARS-Cov-2 infection and should not be used as the sole basis for treatment or other patient management decisions. A negative result may occur with  improper specimen collection/handling, submission of specimen other than nasopharyngeal swab, presence of viral mutation(s) within the areas targeted by this assay, and inadequate number of viral copies(<138 copies/mL). A negative result must be combined with clinical observations, patient history, and epidemiological information. The expected result is Negative.  Fact Sheet for Patients:  EntrepreneurPulse.com.au  Fact Sheet for Healthcare Providers:  IncredibleEmployment.be  This test is no t yet approved or cleared by the [place] FDA and  has been authorized for detection and/or diagnosis of SARS-CoV-2 by FDA  under an Emergency Use Authorization (EUA). This EUA will remain  in effect (meaning this test can be used) for the duration of the COVID-19 declaration under Section 564(b)(1) of the Act, 21 U.S.C.section 360bbb-3(b)(1), unless the authorization is terminated  or revoked sooner.       Influenza A by PCR NEGATIVE NEGATIVE   Influenza B by PCR NEGATIVE NEGATIVE    Comment: (NOTE) The Xpert Xpress SARS-CoV-2/FLU/RSV plus assay is intended as an aid in the diagnosis of influenza from Nasopharyngeal swab specimens and should not be used as a sole basis for treatment. Nasal washings and aspirates are unacceptable for Xpert Xpress SARS-CoV-2/FLU/RSV testing.  Fact Sheet for Patients: EntrepreneurPulse.com.au  Fact Sheet for Healthcare  Providers: IncredibleEmployment.be  This test is not yet approved or cleared by the [place] FDA and has been authorized for detection and/or diagnosis of SARS-CoV-2 by FDA under an Emergency Use Authorization (EUA). This EUA will remain in effect (meaning this test can be used) for the duration of the COVID-19 declaration under Section 564(b)(1) of the Act, 21 U.S.C. section 360bbb-3(b)(1), unless the authorization is terminated or revoked.  Performed at [facility], [place] [address], [place], [place] [address]      Blood Alcohol level:  Lab Results  Component Value Date   ETH <10 60/45/4098    Metabolic Disorder Labs: Lab Results  Component Value Date   HGBA1C 5.5 03/08/2021   MPG 111 03/08/2021   No results found for: PROLACTIN Lab Results  Component Value Date   CHOL 159 03/08/2021   TRIG 67 03/08/2021   HDL 43 03/08/2021   CHOLHDL 3.7 03/08/2021   VLDL 13 03/08/2021   LDLCALC 103 (H) 03/08/2021    Physical Findings: AIMS: Facial and Oral Movements Muscles of Facial Expression: None, normal Lips and Perioral Area: None, normal Jaw: None, normal Tongue: None, normal,Extremity Movements Upper (arms, wrists, hands, fingers): None, normal Lower (legs, knees, ankles, toes): None, normal, Trunk Movements Neck, shoulders, hips: None, normal, Overall Severity Severity of abnormal movements (highest score from questions above): None, normal Incapacitation due to abnormal movements: None, normal Patient's awareness of abnormal movements (rate only patient's report): No Awareness, Dental Status Current problems with teeth and/or dentures?: No Does patient usually wear dentures?: No  CIWA:    COWS:     Musculoskeletal: Strength & Muscle Tone: within normal limits Gait & Station: normal Patient leans: N/A  Psychiatric Specialty Exam: Presentation  General Appearance: Appropriate for Environment  Eye  Contact:Good  Speech:Clear and Coherent  Speech Volume:Normal  Handedness:Right  Mood and Affect  Mood:Anxious; Irritable  Affect:Constricted  Thought Process  Thought Processes:Coherent; Goal Directed  Descriptions of Associations:Intact  Orientation:Full (Time, Place and Person)  Thought Content:WDL  History of Schizophrenia/Schizoaffective disorder:No  Duration of Psychotic Symptoms:No data recorded Hallucinations:No data recorded  Ideas of Reference:None  Suicidal Thoughts:No data recorded  Homicidal Thoughts:No data recorded  Sensorium  Memory:Immediate Fair; Recent Fair; Remote Poor  Judgment:Poor  Insight:Fair  Executive Functions  Concentration:Good  Attention Span:Fair  [place]   Psychomotor Activity  Psychomotor Activity:No data recorded  Assets  Assets:Communication Skills; Financial Resources/Insurance; Housing; Resilience; Social Support; Physical Health  Sleep  Sleep:No data recorded  Physical Exam: Physical Exam Vitals and nursing note reviewed.  Constitutional:      Appearance: Normal appearance.  HENT:     Head: Normocephalic and atraumatic.  Pulmonary:     Effort: Pulmonary effort is normal.     Breath  sounds: Normal breath sounds.  Neurological:     General: No focal deficit present.     Mental Status: He is alert and oriented to person, place, and time.   Review of Systems  Constitutional:  Negative for chills and fever.  HENT:  Negative for hearing loss.        Toothache  Eyes:  Negative for blurred vision.  Respiratory:  Negative for cough and shortness of breath.   Cardiovascular:  Negative for chest pain.  Gastrointestinal:  Negative for abdominal pain, nausea and vomiting.  Genitourinary:  Positive for frequency. Negative for dysuria and flank pain.  Skin:  Negative for rash.  Neurological:  Positive for headaches. Negative for dizziness.  Psychiatric/Behavioral:   Negative for depression, hallucinations and suicidal ideas. The patient is not nervous/anxious.    Blood pressure 131/82, pulse 80, temperature 97.9 F (36.6 C), temperature source Oral, resp. rate 13, height 6' 2" (1.88 m), weight 90.7 kg, SpO2 100 %. Body mass index is 25.68 kg/m.   Treatment Plan Summary:Jamarius Cina is a 30 year old male who presented to [facility], voluntarily with his mother/guardian with complaints of adverse medication effects. Pt had seizure yesterday  Continue Seizure precautions as ordered  Labs reviewed- CT heard normal, HIV and RPR negative, Vitamin B12- 255 ESR-normal, Hb A1c 5.5, TSH 1.052, lipid panel shows cholesterol 159, LDL 103, Triglycerides 67, VLDL 13,  Electrolytes- wnl, Glucose 104. CBC- wnl Heavy metal - normal, ANA, - normal ceruloplasmin - pending  Plan Daily contact with patient to assess and evaluate symptoms and progress in treatment, Medication management    Unspecified schizophrenia spectrum and other psychotic d/o (r/o schizophreniform d/o, r/o schizoaffective d/o, r/o delusional d/o, r/o psychosis secondary to a general medical condition): -Reduce Abilify to 7.5 mg PO daily.   Seizure Patient had 45 seconds witnessed seizure episode yesterday with urinary incontinence and was sent to [facility] long ED.  He was started on Keppra 500 mg twice daily. Per NP, patient mom stated that patient had similar seizure episode in the past due to sleep deprivation. -Seizure precautions - Discussed with neurology Dr. Cheral Marker if Keppra can be switched to Depakote due to psychiatric side effects of Keppra and patient's psychiatric issues.  Recommended to start 750 mg Depakote twice daily and overlap with Keppra for 3 days. -Start Depakote 750 mg twice daily today.  -Continue Keppra 500 mg BID for 3 days ending on 03/15/2021.  Insomnia:  -Continue Melatonin 10 mg qhs   Toothache -Viscous lidocaine solution for mouth Q3H -No dental floss available in the  unit.  Other PRN's  -Acetaminophen 650 mg tablet PO every 6 hours as needed for pian -Maalox 200-200-20 mg/5ML suspension 62m every 4 hours as needed for indigestion -Milk of Magnesia 30 mL PO daily as needed for constipation   Labs ordered for tomorrow: Repeat COVID negative.  Ceruloplasmin pending  15 minute safety checks Encourage participation in the therapeutic milieu Discharge planning in progress   PGY2 VArmando Reichert MD 03/13/2021, 1:43 PM

## 2021-03-13 NOTE — Progress Notes (Signed)
Adult Psychoeducational Group Note  Date:  03/13/2021 Time:  12:09 AM  Group Topic/Focus:  Wrap-Up Group:   The focus of this group is to help patients review their daily goal of treatment and discuss progress on daily workbooks.  Participation Level:  Active  Participation Quality:  Appropriate  Affect:  Appropriate  Cognitive:  Appropriate  Insight: Appropriate  Engagement in Group:  Developing/Improving  Modes of Intervention:  Discussion  Additional Comments:  Pt stated his goal for today was to focus on his treatment plan. Pt stated he accomplished his goal today. Pt stated he did not  talked with his doctor are his social worker about his care today. Pt rated his overall day a 5 out of 10.  Pt stated he spend most of the day over at [facility]. Pt stated he made no calls today. Pt stated he felt better about himself today. Pt stated he was brought back all meals today because of Covid outbreak. Pt stated he took all medications provided today. Pt stated he attend all groups held today. Pt stated his appetite was pretty good today. Pt rated sleep last night was fair. Pt stated the goal tonight was to get some rest. Pt stated he had no physical pain today. Pt deny visual hallucinations and auditory issues tonight. Pt denies thoughts of harming himself or others. Pt stated he would alert staff if anything changed.  Felipa Furnace 03/13/2021, 12:09 AM

## 2021-03-13 NOTE — Progress Notes (Signed)
Recreation Therapy Notes  Date:  9.5.22 Time: 0930 Location: [address]    Group Topic: Stress Management   Goal Area(s) Addresses:  Patient will identify positive stress management techniques. Patient will identify benefits of using stress management post d/c.   Intervention: Packet   Activity :  Patients were given a packet that covered ways to reduce stress.  It also gave examples of techniques to use to deal with stress such as progressive muscle relaxation, deep breathing and guided imagery.       Education:  Stress Management, Discharge Planning.    Education Outcome: Acknowledges Education   Clinical Observations/Feedback: Pt did not participate in discussion.       Caroll Rancher, LRT/CTRS         Caroll Rancher A 03/13/2021 11:45 AM

## 2021-03-13 NOTE — BH IP Treatment Plan (Signed)
Interdisciplinary Treatment and Diagnostic Plan Update  03/13/2021 Time of Session: 10:25am Justin Ponce MRN: [id number]  Principal Diagnosis: Schizophrenia spectrum disorder with psychotic disorder type not yet determined [facility])  Secondary Diagnoses: Principal Problem:   Schizophrenia spectrum disorder with psychotic disorder type not yet determined (HCC) Active Problems:   Seizure (HCC)   Current Medications:  Current Facility-Administered Medications  Medication Dose Route Frequency Provider Last Rate Last Admin   acetaminophen (TYLENOL) tablet 650 mg  650 mg Oral Q6H PRN Laveda Abbe, NP   650 mg at 03/12/21 1511   alum & mag hydroxide-simeth (MAALOX/MYLANTA) 200-200-20 MG/5ML suspension 30 mL  30 mL Oral Q4H PRN Laveda Abbe, NP       ARIPiprazole (ABILIFY) tablet 10 mg  10 mg Oral Daily Laveda Abbe, NP   10 mg at 03/13/21 0749   levETIRAcetam (KEPPRA) tablet 500 mg  500 mg Oral BID Laveda Abbe, NP   500 mg at 03/13/21 0749   lidocaine (XYLOCAINE) 2 % viscous mouth solution 15 mL  15 mL Mouth/Throat Q3H PRN Laveda Abbe, NP       magnesium hydroxide (MILK OF MAGNESIA) suspension 30 mL  30 mL Oral Daily PRN Laveda Abbe, NP       melatonin tablet 10 mg  10 mg Oral QHS Nwoko, Uchenna E, PA   10 mg at 03/12/21 2136   traZODone (DESYREL) tablet 50 mg  50 mg Oral QHS PRN Nwoko, Uchenna E, PA       PTA Medications: Medications Prior to Admission  Medication Sig Dispense Refill Last Dose   benztropine (COGENTIN) 0.5 MG tablet Take 0.5 mg by mouth 2 (two) times daily.      risperiDONE (RISPERDAL) 1 MG tablet Take 1 mg by mouth 2 (two) times daily.       Patient Stressors: Health problems   Medication change or noncompliance    Patient Strengths: Printmaker for treatment/growth  Supportive family/friends   Treatment Modalities: Medication Management, Group therapy, Case management,  1 to 1 session with  clinician, Psychoeducation, Recreational therapy.   Physician Treatment Plan for Primary Diagnosis: Schizophrenia spectrum disorder with psychotic disorder type not yet determined (HCC) Long Term Goal(s): Improvement in symptoms so as ready for discharge   Short Term Goals: Ability to identify changes in lifestyle to reduce recurrence of condition will improve Ability to verbalize feelings will improve Ability to demonstrate self-control will improve Ability to identify and develop effective coping behaviors will improve Compliance with prescribed medications will improve Ability to identify triggers associated with substance abuse/mental health issues will improve  Medication Management: Evaluate patient's response, side effects, and tolerance of medication regimen.  Therapeutic Interventions: 1 to 1 sessions, Unit Group sessions and Medication administration.  Evaluation of Outcomes: Progressing  Physician Treatment Plan for Secondary Diagnosis: Principal Problem:   Schizophrenia spectrum disorder with psychotic disorder type not yet determined (HCC) Active Problems:   Seizure (HCC)  Long Term Goal(s): Improvement in symptoms so as ready for discharge   Short Term Goals: Ability to identify changes in lifestyle to reduce recurrence of condition will improve Ability to verbalize feelings will improve Ability to demonstrate self-control will improve Ability to identify and develop effective coping behaviors will improve Compliance with prescribed medications will improve Ability to identify triggers associated with substance abuse/mental health issues will improve     Medication Management: Evaluate patient's response, side effects, and tolerance of medication regimen.  Therapeutic Interventions: 1 to 1  sessions, Unit Group sessions and Medication administration.  Evaluation of Outcomes: Progressing   RN Treatment Plan for Primary Diagnosis: Schizophrenia spectrum disorder  with psychotic disorder type not yet determined (HCC) Long Term Goal(s): Knowledge of disease and therapeutic regimen to maintain health will improve  Short Term Goals: Ability to remain free from injury will improve, Ability to verbalize frustration and anger appropriately will improve, Ability to identify and develop effective coping behaviors will improve, and Compliance with prescribed medications will improve  Medication Management: RN will administer medications as ordered by provider, will assess and evaluate patient's response and provide education to patient for prescribed medication. RN will report any adverse and/or side effects to prescribing provider.  Therapeutic Interventions: 1 on 1 counseling sessions, Psychoeducation, Medication administration, Evaluate responses to treatment, Monitor vital signs and CBGs as ordered, Perform/monitor CIWA, COWS, AIMS and Fall Risk screenings as ordered, Perform wound care treatments as ordered.  Evaluation of Outcomes: Progressing   LCSW Treatment Plan for Primary Diagnosis: Schizophrenia spectrum disorder with psychotic disorder type not yet determined (HCC) Long Term Goal(s): Safe transition to appropriate next level of care at discharge, Engage patient in therapeutic group addressing interpersonal concerns.  Short Term Goals: Engage patient in aftercare planning with referrals and resources, Increase social support, Increase ability to appropriately verbalize feelings, Identify triggers associated with mental health/substance abuse issues, and Increase skills for wellness and recovery  Therapeutic Interventions: Assess for all discharge needs, 1 to 1 time with Social worker, Explore available resources and support systems, Assess for adequacy in community support network, Educate family and significant other(s) on suicide prevention, Complete Psychosocial Assessment, Interpersonal group therapy.  Evaluation of Outcomes:  Progressing   Progress in Treatment: Attending groups: Yes. Participating in groups: Yes. Taking medication as prescribed: Yes. Toleration medication: Yes. Family/Significant other contact made: Yes, individual(s) contacted:  mother Patient understands diagnosis: No. Discussing patient identified problems/goals with staff: Yes. Medical problems stabilized or resolved: Yes. Denies suicidal/homicidal ideation: Yes. Issues/concerns per patient self-inventory: No.   New problem(s) identified: No, Describe:  none  New Short Term/Long Term Goal(s): medication stabilization, elimination of SI thoughts, development of comprehensive mental wellness plan.    Patient Goals:  "To get more sleep"  Discharge Plan or Barriers: Pt is to follow up with [facility] for therapy and medication management.  Reason for Continuation of Hospitalization: Delusions  Medication stabilization  Estimated Length of Stay: 3-5 days   Scribe for Treatment Team: Otelia Santee, LCSW 03/13/2021 11:14 AM

## 2021-03-13 NOTE — [facility] Group Notes (Signed)
Topic:   Due to the acuity, complex discharge plans, and Covid-19 precautions, group was not held. Patient was provided therapeutic worksheets and asked to meet with CSW as needed. 

## 2021-03-13 NOTE — Progress Notes (Signed)
Pt has been alert and oriented to person, place, time and situation. Pt is calm, cooperative, denies SI/HI/AVH, denies depression and anxiety. Pt has a flat affect, is mediation complaint. Will continue to monitor pt per Q15 minute face checks and monitor for safety and progress.

## 2021-03-14 DIAGNOSIS — F2081 Schizophreniform disorder: Secondary | ICD-10-CM

## 2021-03-14 MED ORDER — LEVETIRACETAM 100 MG/ML PO SOLN
500.0000 mg | Freq: Two times a day (BID) | ORAL | Status: AC
  Administered 2021-03-14 – 2021-03-15 (×3): 500 mg via ORAL
  Filled 2021-03-14 (×3): qty 5

## 2021-03-14 MED ORDER — LEVETIRACETAM 100 MG/ML PO SOLN
500.0000 mg | Freq: Once | ORAL | Status: AC
  Administered 2021-03-14: 500 mg via ORAL
  Filled 2021-03-14: qty 5

## 2021-03-14 NOTE — Progress Notes (Addendum)
[facility] MD Progress Note  03/14/2021 3:44 PM Justin Ponce  MRN:  [id number]  Subjective: Patient was seen and examined today serially by Dr. Rosita Kea and  later Dr. Lovette Cliche.  Patient states his mood is "very perky" today and rates it 5/10 (10 is the best mood).  Patient states he slept well last night and he denies any headache today.  He reduced the excessive juice intake and states his urination frequency has also got better and he is not urinating a lot like yesterday.  Patient thinks that Abilify 7.5 mg is better as it is not giving him headache.  He states that he was not able to take Depakote pills so it was changed to Depakote sprinkles.  He states that he is not able to swallow Keppra because he was told that Keppra cannot be broken or chewed.  He is interested in other forms of Keppra.  States his appetite good. When asked about plans to call [employer], He states he joked about calling from the hospital as he already knew that I won't work. 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 Also has some ideas about Drones.  He states that most likely we going to have a war soon as [place] don't want Trump to be the president and he thinks that Trump was better president. He denies SI, HI and AVH. States he didn't talk to his Mom yesterday. He denies any headache, nausea, vomiting, SOB, cough. He has been tolerating Abilify without any side effects. He states his toothache is still there but much better.    Collateral information from mom Justin Ponce @ [phone].  Mom states that he has always been a very different kid growing up. She screened him in kindergarten and evaluators were thinking between 3 diagnosis ADHD, ODD, learning disability.  She also suspects that he has Autism which  was never diagnosed. She states that he had multiple medication trials for ADHD but nothing worked for him.  She states that he was in behavioral classroom  where he was pulled out of classroom.  He was also put on Zyprexa and Aderall which caused him meltdown. 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  She states that he never liked school and did not want to go to school, and he got some counseling in 11th grade.  He completed 1 year of community college and completed industrial maintenance.  She states that he wanted to go to [employer].  At some point in 2009 reportedly drank a lot of energy drinks, didn't sleep and had a seizure but no major mood symptoms at that time.  He went to [employer] from March 2010 to July 2010 but did not have very good experience there.  She states that he never held a job for more than 9 months.  He was always a loner, does not go on dates, has never been in a relationship and never parties.  She states that he did not spend excessive money on anything and handled money wisely.  She states that he lost about 20 to 25 pounds weight in June which he said was because of exercise.  She states that he has a lot of social anxiety and is not a big talker.    She states that in June, he started pacing a lot and talking out loudly and thinking that [employer]  being brainwashed.  At that time, he was also talking about some memories of something happened in the past that did not make sense.  She states that his speech was little fast at that time and was little agitated.  She states that on July 7 she left for [facility] to her daughter.  And her son was staying with dog alone at home.  She states that that was the longest she left him alone.  She states that on Saturday they got a phone call from daughter's friend with police in the driveway and heard that Zenia Resides was suicidal, drank gasoline and fired shots in the ground.   She also got to know that he drank her liquor from home and also bought some beer case from store. She states she is not sure exactly what happened that day that trigger him.  Principal Problem:  Schizophreniform disorder ([place]) Diagnosis: Principal Problem:   Schizophreniform disorder ([place]) Active Problems:   Seizure ([place])  Total Time spent with patient: 20 minutes with pt. Dr. Rosita Kea additionally spent 45 minutes with mother on phone.   Past Psychiatric History: None  Past Medical History: History reviewed. No pertinent past medical history. History reviewed. No pertinent surgical history. Family History: History reviewed. No pertinent family history. Family Psychiatric  History: Mother-Depression. Paternal side with mental illness but not sure what diagnosis they have.  Social History:  Social History   Substance and Sexual Activity  Alcohol Use None     Social History   Substance and Sexual Activity  Drug Use Not on file    Social History   Socioeconomic History   Marital status: Single    Spouse name: Not on file   Number of children: Not on file   Years of education: Not on file   Highest education level: Not on file  Occupational History   Not on file  Tobacco Use   Smoking status: Never   Smokeless tobacco: Never  Substance and Sexual Activity   Alcohol use: Not on file   Drug use: Not on file   Sexual activity: Not on file  Other Topics Concern   Not on file  Social History Narrative   Not on file   Social Determinants of Health   Financial Resource Strain: Not on file  Food Insecurity: Not on file  Transportation Needs: Not on file  Physical Activity: Not on file  Stress: Not on file  Social Connections: Not on file   Additional Social History:     Sleep: Good  Appetite:  Good  Current Medications: Current Facility-Administered Medications  Medication Dose Route Frequency Provider Last Rate Last Admin   acetaminophen (TYLENOL) tablet 650 mg  650 mg Oral Q6H PRN Ethelene Hal, NP   650 mg at 03/13/21 1323   alum & mag hydroxide-simeth (MAALOX/MYLANTA) 200-200-20 MG/5ML suspension 30 mL  30 mL Oral Q4H PRN Ethelene Hal,  NP       ARIPiprazole (ABILIFY) tablet 7.5 mg  7.5 mg Oral Daily Doda, Vandana, MD   7.5 mg at 03/14/21 5053   divalproex (DEPAKOTE SPRINKLE) capsule 750 mg  750 mg Oral Q12H Lindon Romp A, NP   750 mg at 03/14/21 0820   levETIRAcetam (KEPPRA) 100 MG/ML solution 500 mg  500 mg Oral BID ,  A       lidocaine (XYLOCAINE) 2 % viscous mouth solution 15 mL  15 mL Mouth/Throat Q3H PRN Ethelene Hal, NP  magnesium hydroxide (MILK OF MAGNESIA) suspension 30 mL  30 mL Oral Daily PRN Ethelene Hal, NP       melatonin tablet 10 mg  10 mg Oral QHS Nwoko, Uchenna E, PA   10 mg at 03/13/21 2055   traZODone (DESYREL) tablet 50 mg  50 mg Oral QHS PRN Malachy Mood, PA        Lab Results:  Results for orders placed or performed during the hospital encounter of 03/07/21 (from the past 48 hour(s))  Resp Panel by RT-PCR (Flu A&B, Covid) Nasopharyngeal Swab     Status: None   Collection Time: 03/13/21  4:25 AM   Specimen: Nasopharyngeal Swab; Nasopharyngeal(NP) swabs in vial transport medium  Result Value Ref Range   SARS Coronavirus 2 by RT PCR NEGATIVE NEGATIVE    Comment: (NOTE) SARS-CoV-2 target nucleic acids are NOT DETECTED.  The SARS-CoV-2 RNA is generally detectable in upper respiratory specimens during the acute phase of infection. The lowest concentration of SARS-CoV-2 viral copies this assay can detect is 138 copies/mL. A negative result does not preclude SARS-Cov-2 infection and should not be used as the sole basis for treatment or other patient management decisions. A negative result may occur with  improper specimen collection/handling, submission of specimen other than nasopharyngeal swab, presence of viral mutation(s) within the areas targeted by this assay, and inadequate number of viral copies(<138 copies/mL). A negative result must be combined with clinical observations, patient history, and epidemiological information. The expected result is  Negative.  Fact Sheet for Patients:  EntrepreneurPulse.com.au  Fact Sheet for Healthcare Providers:  IncredibleEmployment.be  This test is no t yet approved or cleared by the [place] FDA and  has been authorized for detection and/or diagnosis of SARS-CoV-2 by FDA under an Emergency Use Authorization (EUA). This EUA will remain  in effect (meaning this test can be used) for the duration of the COVID-19 declaration under Section 564(b)(1) of the Act, 21 U.S.C.section 360bbb-3(b)(1), unless the authorization is terminated  or revoked sooner.       Influenza A by PCR NEGATIVE NEGATIVE   Influenza B by PCR NEGATIVE NEGATIVE    Comment: (NOTE) The Xpert Xpress SARS-CoV-2/FLU/RSV plus assay is intended as an aid in the diagnosis of influenza from Nasopharyngeal swab specimens and should not be used as a sole basis for treatment. Nasal washings and aspirates are unacceptable for Xpert Xpress SARS-CoV-2/FLU/RSV testing.  Fact Sheet for Patients: EntrepreneurPulse.com.au  Fact Sheet for Healthcare Providers: IncredibleEmployment.be  This test is not yet approved or cleared by the [place] FDA and has been authorized for detection and/or diagnosis of SARS-CoV-2 by FDA under an Emergency Use Authorization (EUA). This EUA will remain in effect (meaning this test can be used) for the duration of the COVID-19 declaration under Section 564(b)(1) of the Act, 21 U.S.C. section 360bbb-3(b)(1), unless the authorization is terminated or revoked.  Performed at [facility], [place] [address], [place], [place] [address]      Blood Alcohol level:  Lab Results  Component Value Date   ETH <10 74/94/4967    Metabolic Disorder Labs: Lab Results  Component Value Date   HGBA1C 5.5 03/08/2021   MPG 111 03/08/2021   No results found for: PROLACTIN Lab Results  Component Value Date   CHOL 159  03/08/2021   TRIG 67 03/08/2021   HDL 43 03/08/2021   CHOLHDL 3.7 03/08/2021   VLDL 13 03/08/2021   LDLCALC 103 (H) 03/08/2021    Musculoskeletal:  Strength & Muscle Tone: within normal limits Gait & Station: normal Patient leans: N/A  Psychiatric Specialty Exam: Presentation  General Appearance: Appropriate for Environment Eye Contact:Good Speech:Clear and Coherent Speech Volume:Normal Handedness:Right  Mood and Affect  Mood:Euthymic Affect:Constricted  Thought Process  Thought Processes:Coherent; Goal Directed Descriptions of Associations:Intact Orientation:Full (Time, Place and Person) Thought Content: some grandiosity History of Schizophrenia/Schizoaffective disorder:No - schizophreniform.  Duration of Psychotic Symptoms: 3 months Hallucinations:Hallucinations: None  Ideas of Reference:None Suicidal Thoughts:Suicidal Thoughts: No Homicidal Thoughts:Homicidal Thoughts: No  Sensorium  Memory:Immediate Fair; Recent Fair; Remote Fair  Judgment:Poor  Insight:Fair  Executive Functions  Concentration:Good Attention Span:Fair [place]   Psychomotor Activity  Psychomotor Activity:Psychomotor Activity: Normal  Assets  Assets:Communication Skills; Financial Resources/Insurance; Housing; Resilience; Social Support; Physical Health  Sleep  Sleep:Sleep: Good Number of Hours of Sleep: 6.75  Physical Exam: Physical Exam Vitals and nursing note reviewed.  Constitutional:      Appearance: Normal appearance.  HENT:     Head: Normocephalic and atraumatic.  Pulmonary:     Effort: Pulmonary effort is normal.  Neurological:     General: No focal deficit present.     Mental Status: He is alert and oriented to person, place, and time.   Review of Systems  HENT:  Negative for hearing loss.        Mild Toothache  Respiratory:  Negative for cough and shortness of breath.   Gastrointestinal:  Negative for abdominal pain,  nausea and vomiting.  Genitourinary:  Negative for dysuria.  Neurological:  Negative for dizziness and headaches.  Psychiatric/Behavioral:  Negative for depression, hallucinations and suicidal ideas. The patient is not nervous/anxious.   All other systems reviewed and are negative.  Blood pressure 125/76, pulse 89, temperature 97.9 F (36.6 C), temperature source Oral, resp. rate 16, height 6' 2" (1.88 m), weight 90.7 kg, SpO2 98 %. Body mass index is 25.68 kg/m.   Treatment Plan Summary:Merlyn Brubacher is a 30 year old male who [facility] to [facility] uvol[facility] with his mother who is his guardian. He has a history of difficulty functioning in work/school settings beginning in elementary school; has never held a job for >9 months, generally doesn't make friends, go to parties, etc. Was diagnosed with ADHD as a child. Had generally been living with his parents as an adult. This June, he lost 20 pounds due to "exercise" alongside long-standing social withdrawal. He then began pacing and started talking about the [employer] and brainwashing. At some point, he went out and bought a few cases of beer, covered himself in gasoline and made suicidal threats; he was arrested which led to some charges and his mom taking guardianship over him. In interview, he does describe some elements of mania (impuslivity, grandiosity, flight of ideas, sleep deficiency) around that time. Has not had other discrete periods of mania aside from sleeplessness with normal mood when overconsuming energy drinks - currently has some grandiosity but no other sx. Overall he appears to meet criteria for schizophreniform disorder due to duration of psychotic sx >1 month and <6 months; given some of the symptoms of mania from initial episode may meet criteria for schizoaffective disorder in a few months.  He was started on abilify due to bad experiences on risperidone and zyprexa and pt and mother preference for LAI option. He suffered a witnessed seizure  over the weekend of 9/3-9/4 after getting olanzapine on top of abilify; his psychotropic medication has since been streamlined. Neurology started keppra, which we are transitioning  to depakote after further discussion with neurology due to side effects of irritability, depression, etc. Given manic symptoms as above depakote is overall a better medication for him.    Continue Seizure precautions as ordered  Labs reviewed- CT heard normal, HIV and RPR negative, Vitamin B12- 255  ESR-normal, Hb A1c 5.5, TSH 1.052, lipid panel shows cholesterol 159, LDL 103, Triglycerides 67, VLDL 13,   Qtc 413 from ED in EKG 9/4  Electrolytes- wnl, Glucose 104. CBC- wnl Heavy metal - normal, ANA, - normal ceruloplasmin - pending  Plan Daily contact with patient to assess and evaluate symptoms and progress in treatment, Medication management     Schizophreniform d/o -Continue Abilify 7.5 mg PO daily. Not planning for LAI while in the hospital due to recent seizure episode. Will possibly recommend it for outpatient basis.  -- depakote as adjunctive for this diagnosis (officially prescribed for seizures)  Seizure - Neurology Consulted, Appreciate recommendations.  - Seizure precautions. --Continue Depakote sprinkles 750 mg twice daily.   -Continue Keppra 500 mg BID for 3 days ending on 03/15/2021. (Pharmacy: Please arrange Liquid form if possible)  Insomnia:  -Continue Melatonin 10 mg qhs   Toothache -Viscous lidocaine solution for mouth Q3H PRN  Other PRN's  -Acetaminophen 650 mg tablet PO every 6 hours as needed for pian -Maalox [phone] mg/5ML suspension 3m every 4 hours as needed for indigestion -Milk of Magnesia 30 mL PO daily as needed for constipation   Labs ordered for tomorrow: Repeat COVID negative (03/13/21).  Ceruloplasmin pending - drawn 8/31  15 minute safety checks Encourage participation in the therapeutic milieu Discharge planning in progress   PGY2 VArmando Reichert  MD 03/14/2021, 3:44 PM  I have independently evaluated the patient during a face-to-face assessment on 03/14/21. I reviewed the patient's chart, and I participated in key portions of the service. I discussed the case with the [employer] and I agree with the assessment and plan of care as documented in the House Officer's note.    MJeral Fruit MD

## 2021-03-14 NOTE — Progress Notes (Signed)
Pt rates his anxiety, depression and hopelessness all 0/10. Reports he slept well with fair appetite, normal energy and good concentration level. A & O X3. Denies SI, HI, AVH and pain when assessed. Noted with bright affect, fair eye contact, logical speech but is anxious on interactions. Pt is preoccupied about discharge "I think I should be going home now, this is driving me crazy being in here". Emotional support offered to pt this shift. Q 15 minutes safety checks maintained on unit without self harm gestures or outburst to not thus far. All medications given with verbal education and effects monitored. Pt encouraged to comply with current treatment regimen and to verbalize concerns. Pt remains safe on unit. Tolerates his medications, meals and fluids well. Observed interacting well with others in milieu. Engaged in 1:1 group sessions this shift. Denies discomfort at this time.

## 2021-03-14 NOTE — Progress Notes (Signed)
Recreation Therapy Notes  Date: 9.6.22 Time: 0930 Location: [address]  Group Topic: Anxiety  Goal Area(s) Addresses:  Patient will identify what triggers anxiety. Patient will identify symptoms of anxiety. Patient will identify coping skills for anxiety.  Behavioral Response: Minimal  Intervention: Worksheet  Activity:  LRT met with each patient 1:1 to discuss how they are affected by anxiety and the coping skills used when dealing anxiety.  Education: Radiographer, therapeutic, Dentist.   Education Outcome: Acknowledges understanding/In group clarification offered/Needs additional education.   Clinical Observations/Feedback: Pt participated in answering questions dealing with being anxious.  Pt identified triggers as inability to freely leave, not being able to go home and being told he can't do something.  Pt uses the coping skill of coloring.  Pt admitted these things don't mean anything, they were just answers he gave because "you had to write something down".    Victorino Sparrow, LRT/CTRS        Victorino Sparrow A 03/14/2021 11:42 AM

## 2021-03-14 NOTE — Progress Notes (Signed)
Pt was given daily packet and discussed the goal for today.  Pt states that he is feeling good and is continuing medication management.

## 2021-03-15 LAB — RESP PANEL BY RT-PCR (FLU A&B, COVID) ARPGX2
Influenza A by PCR: NEGATIVE
Influenza B by PCR: NEGATIVE
SARS Coronavirus 2 by RT PCR: NEGATIVE

## 2021-03-15 NOTE — BHH Counselor (Signed)
Topic:   Due to Covid-19 precautions, group was not held. Patient was provided therapeutic worksheets and asked to meet with CSW as needed.   Cataleah Stites, LCSW, LCAS Clincal Social Worker  [facility]  
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
## 2021-03-15 NOTE — BHH Group Notes (Signed)
The focus of this group is to help patients establish daily goals to achieve during treatment and discuss how the patient can incorporate goal setting into their daily lives to aide in recovery.  Patient understands the reason for his need of medication He also stated. " I know why I need to take the medication." Patient verbalize he would work to become stable on his medication.

## 2021-03-15 NOTE — BHH Group Notes (Signed)
Adult Psychoeducational Group Note  Date:  03/15/2021 Time:  3:33 PM  Group Topic/Focus:  Building Self Esteem:   The Focus of this group is helping patients become aware of the effects of self-esteem on their lives, the things they and others do that enhance or undermine their self-esteem, seeing the relationship between their level of self-esteem and the choices they make and learning ways to enhance self-esteem.  Participation Level:  Active  Participation Quality:  Appropriate  Affect:  Appropriate  Cognitive:  Appropriate  Insight: Appropriate  Engagement in Group:  Engaged  Modes of Intervention:  Discussion  Additional Comments:    Reymundo Poll 03/15/2021, 3:33 PM

## 2021-03-15 NOTE — Progress Notes (Signed)
   03/15/21 0100  Psych Admission Type (Psych Patients Only)  Admission Status Voluntary  Psychosocial Assessment  Patient Complaints Worrying  Eye Contact Fair  Facial Expression Anxious  Affect Appropriate to circumstance  Speech Logical/coherent  Interaction Assertive  Motor Activity Slow  Appearance/Hygiene Unremarkable  Behavior Characteristics Cooperative  Mood Pleasant  Aggressive Behavior  Effect No apparent injury  Thought Process  Coherency WDL  Content WDL  Delusions None reported or observed  Perception WDL  Hallucination None reported or observed  Judgment Poor  Confusion None  Danger to Self  Current suicidal ideation? Denies  Danger to Others  Danger to Others None reported or observed

## 2021-03-15 NOTE — Group Note (Signed)
Recreation Therapy Group Note   Group Topic:Self-Esteem  Group Date: 03/15/2021 Start Time: 1000 End Time: 1040 Facilitators: Caroll Rancher, LRT/CTRS Location:  [address]  Goal Area(s) Addresses:  Patient will appropriately identify what self esteem is.  Patient will create a shield of armor describing themselves.  Patient will successfully identify positive attributes about themselves.  Patient will acknowledge benefit of improved self-esteem.  Patient will follow instructions on 1st prompt.   Intervention / Activity: Self-Esteem Shield. Patient engaged in 1:1 session LRT that focused on self esteem. Patient identified what self esteem is.  LRT gave handout to patient of a blank shield and an explanation of what was to go in each quadrant. Patient was asked to create their own shield to show off their unique attributes, four quadrants reflected the following:   The Upper Left quadrant- two people or things you value. The Upper Right quadrant- two lessons you have learned thus far in life. The Lower Left quadrant- three qualities that make you unique. The Lower Right quadrant- one goal you want to work towards.    Patients were provided sheets with the shield printed on them and materials to complete the assignment.   Education: Self esteem, Communication, Positive self-talk, Discharge Planning    Affect/Mood: Appropriate   Participation Level: Engaged   Participation Quality: Independent   Behavior: Appropriate   Speech/Thought Process: Organized   Insight: Good   Judgement: Good   Modes of Intervention: 1:1 activity with LRT due to COVID on unit   Patient Response to Interventions:  Engaged   Education Outcome:  Acknowledges education   Clinical Observations/Individualized Feedback:  Pt appeared to be enthusiastic to get the shield.  Pt used the shield to make his own design on it and color it in a way that was special to him.  Pt did not answer any of the  questions pertaining to the quadrants of the shield.   Plan: Continue to engage patient in RT group sessions 2-3x/week.   Caroll Rancher, LRT/CTRS  03/15/2021 1:55 PM

## 2021-03-16 DIAGNOSIS — F2081 Schizophreniform disorder: Principal | ICD-10-CM

## 2021-03-16 LAB — VALPROIC ACID LEVEL: Valproic Acid Lvl: 80 ug/mL (ref 50.0–100.0)

## 2021-03-16 LAB — CERULOPLASMIN: Ceruloplasmin: 19.1 mg/dL (ref 16.0–31.0)

## 2021-03-16 NOTE — Group Note (Signed)
Recreation Therapy Group Note   Group Topic:Team Building  Group Date: 03/16/2021 Start Time: 0945 End Time: 1045 Facilitators: Caroll Rancher, LRT/CTRS Location:  400 Hall  Group Topic: Communication, Team Building, Problem Solving  Goal Area(s) Addresses:  Patient will effectively work with peer towards shared goal.  Patient will identify skill used to make activity successful.  Patient will identify how skills used during activity can be used to reach post d/c goals.   Activity: In team's, using 25 small pipe cleaners, patients were asked to build the tallest free standing tower possible.    Education: Pharmacist, community, Building control surveyor.     Affect/Mood: Appropriate, Happy   Participation Level: Engaged   Participation Quality: Independent   Behavior: Appropriate and Eager   Speech/Thought Process: Focused and Organized   Insight: Good   Judgement: Good   Modes of Intervention: Group work   Patient Response to Interventions:  Engaged   Education Outcome:  Acknowledges education and In group clarification offered    Clinical Observations/Individualized Feedback: Pt worked well with peers in coming up with a concept that would for the group in creating the tower.  Pt was focused and intent on completing the task.  Pt was bright and glad to be able to do something else besides worksheets.     Plan: Continue to engage patient in RT group sessions 2-3x/week.   Caroll Rancher, LRT/CTRS 03/16/2021 1:29 PM

## 2021-03-16 NOTE — Progress Notes (Signed)
[facility] MD Progress Note  03/16/2021 11:18 AM Juvon Teater  MRN:  [id number]  Subjective: Patient was seen and examined today.  Patient states his mood is "cheerfull" today   Patient states he slept well last night and he denies any headache. He states his roommate woke up at night but he slept well.  Patient states he is tolerating Abilify 7.5 mg well without any side effects.  He stated he is is happy that he is close to being discharged.  He has been tolerating Depakote well without any side effects.  Patient states he still has a lot of ideas but thinks that it will be difficult to execute because it will need a lot of Cash.  Patient states that he is thinking about these ideas because he has a lot of free time here in the unit and nothing to do.  He states that if he goes back to his usual life he may not have time to think about these ideas.  He states that he is still able to control his mind. He denies any headache, nausea, vomiting, SOB, cough.  He denies SI, HI, and AVH.  Principal Problem: Schizophreniform disorder ([place]) Diagnosis: Principal Problem:   Schizophreniform disorder ([place]) Active Problems:   Seizure ([place])  Total Time spent with patient: 20 minutes   Past Psychiatric History: None  Past Medical History: History reviewed. No pertinent past medical history. History reviewed. No pertinent surgical history. Family History: History reviewed. No pertinent family history. Family Psychiatric  History: Mother-Depression. Paternal side with mental illness but not sure what diagnosis they have.  Social History:  Social History   Substance and Sexual Activity  Alcohol Use None     Social History   Substance and Sexual Activity  Drug Use Not on file    Social History   Socioeconomic History   Marital status: Single    Spouse name: Not on file   Number of children: Not on file   Years of education: Not on file   Highest education level: Not on file  Occupational History   Not on  file  Tobacco Use   Smoking status: Never   Smokeless tobacco: Never  Substance and Sexual Activity   Alcohol use: Not on file   Drug use: Not on file   Sexual activity: Not on file  Other Topics Concern   Not on file  Social History Narrative   Not on file   Social Determinants of Health   Financial Resource Strain: Not on file  Food Insecurity: Not on file  Transportation Needs: Not on file  Physical Activity: Not on file  Stress: Not on file  Social Connections: Not on file   Additional Social History:     Sleep: Good  Appetite:  Good  Current Medications: Current Facility-Administered Medications  Medication Dose Route Frequency Provider Last Rate Last Admin   acetaminophen (TYLENOL) tablet 650 mg  650 mg Oral Q6H PRN Ethelene Hal, NP   650 mg at 03/13/21 1323   alum & mag hydroxide-simeth (MAALOX/MYLANTA) 200-200-20 MG/5ML suspension 30 mL  30 mL Oral Q4H PRN Ethelene Hal, NP       ARIPiprazole (ABILIFY) tablet 7.5 mg  7.5 mg Oral Daily Doda, Vandana, MD   7.5 mg at 03/16/21 0809   divalproex (DEPAKOTE SPRINKLE) capsule 750 mg  750 mg Oral Q12H Lindon Romp A, NP   750 mg at 03/16/21 0809   lidocaine (XYLOCAINE) 2 % viscous mouth solution 15 mL  15 mL Mouth/Throat Q3H PRN Ethelene Hal, NP       magnesium hydroxide (MILK OF MAGNESIA) suspension 30 mL  30 mL Oral Daily PRN Ethelene Hal, NP       melatonin tablet 10 mg  10 mg Oral QHS Nwoko, Uchenna E, PA   10 mg at 03/15/21 2122   traZODone (DESYREL) tablet 50 mg  50 mg Oral QHS PRN Malachy Mood, PA        Lab Results:  Results for orders placed or performed during the hospital encounter of 03/07/21 (from the past 48 hour(s))  Resp Panel by RT-PCR (Flu A&B, Covid) Nasopharyngeal Swab     Status: None   Collection Time: 03/15/21  4:53 AM   Specimen: Nasopharyngeal Swab; Nasopharyngeal(NP) swabs in vial transport medium  Result Value Ref Range   SARS Coronavirus 2 by RT PCR  NEGATIVE NEGATIVE    Comment: (NOTE) SARS-CoV-2 target nucleic acids are NOT DETECTED.  The SARS-CoV-2 RNA is generally detectable in upper respiratory specimens during the acute phase of infection. The lowest concentration of SARS-CoV-2 viral copies this assay can detect is 138 copies/mL. A negative result does not preclude SARS-Cov-2 infection and should not be used as the sole basis for treatment or other patient management decisions. A negative result may occur with  improper specimen collection/handling, submission of specimen other than nasopharyngeal swab, presence of viral mutation(s) within the areas targeted by this assay, and inadequate number of viral copies(<138 copies/mL). A negative result must be combined with clinical observations, patient history, and epidemiological information. The expected result is Negative.  Fact Sheet for Patients:  EntrepreneurPulse.com.au  Fact Sheet for Healthcare Providers:  IncredibleEmployment.be  This test is no t yet approved or cleared by the [place] FDA and  has been authorized for detection and/or diagnosis of SARS-CoV-2 by FDA under an Emergency Use Authorization (EUA). This EUA will remain  in effect (meaning this test can be used) for the duration of the COVID-19 declaration under Section 564(b)(1) of the Act, 21 U.S.C.section 360bbb-3(b)(1), unless the authorization is terminated  or revoked sooner.       Influenza A by PCR NEGATIVE NEGATIVE   Influenza B by PCR NEGATIVE NEGATIVE    Comment: (NOTE) The Xpert Xpress SARS-CoV-2/FLU/RSV plus assay is intended as an aid in the diagnosis of influenza from Nasopharyngeal swab specimens and should not be used as a sole basis for treatment. Nasal washings and aspirates are unacceptable for Xpert Xpress SARS-CoV-2/FLU/RSV testing.  Fact Sheet for Patients: EntrepreneurPulse.com.au  Fact Sheet for Healthcare  Providers: IncredibleEmployment.be  This test is not yet approved or cleared by the [place] FDA and has been authorized for detection and/or diagnosis of SARS-CoV-2 by FDA under an Emergency Use Authorization (EUA). This EUA will remain in effect (meaning this test can be used) for the duration of the COVID-19 declaration under Section 564(b)(1) of the Act, 21 U.S.C. section 360bbb-3(b)(1), unless the authorization is terminated or revoked.  Performed at [facility], [place] [address] [place], [place] [address]      Blood Alcohol level:  Lab Results  Component Value Date   ETH <10 43/88/8757    Metabolic Disorder Labs: Lab Results  Component Value Date   HGBA1C 5.5 03/08/2021   MPG 111 03/08/2021   No results found for: PROLACTIN Lab Results  Component Value Date   CHOL 159 03/08/2021   TRIG 67 03/08/2021   HDL 43 03/08/2021   CHOLHDL 3.7 03/08/2021  VLDL 13 03/08/2021   LDLCALC 103 (H) 03/08/2021    Musculoskeletal: Strength & Muscle Tone: within normal limits Gait & Station: normal Patient leans: N/A  Psychiatric Specialty Exam: Presentation  General Appearance: Appropriate for Environment Eye Contact:Good Speech:Clear and Coherent Speech Volume:Normal Handedness:Right  Mood and Affect  Mood:Euthymic Affect:Congruent; Full Range  Thought Process  Thought Processes:Coherent; Goal Directed Descriptions of Associations:Intact Orientation:Full (Time, Place and Person) Thought Content: some grandiosity History of Schizophrenia/Schizoaffective disorder:No  - schizophreniform.  Duration of Psychotic Symptoms: 3 months Hallucinations:Hallucinations: None  Ideas of Reference:None Suicidal Thoughts:Suicidal Thoughts: No Homicidal Thoughts:Homicidal Thoughts: No  Sensorium  Memory:Immediate Good; Recent Fair; Remote Fair  Judgment:Poor  Insight:Fair  Executive Functions  Concentration:Good Attention  Span:Good [place] of Knowledge:Good Language:Good   Psychomotor Activity  Psychomotor Activity:Psychomotor Activity: Normal  Assets  Assets:Communication Skills; Financial Resources/Insurance; Housing; Social Support; Physical Health  Sleep  Sleep:Sleep: Good Number of Hours of Sleep: 6.75  Physical Exam: Physical Exam Vitals and nursing note reviewed.  Constitutional:      Appearance: Normal appearance.  HENT:     Head: Normocephalic and atraumatic.  Pulmonary:     Effort: Pulmonary effort is normal.  Neurological:     General: No focal deficit present.     Mental Status: He is alert and oriented to person, place, and time.   Review of Systems  HENT:  Negative for hearing loss.   Respiratory:  Negative for cough and shortness of breath.   Gastrointestinal:  Negative for abdominal pain, nausea and vomiting.  Genitourinary:  Negative for dysuria.  Neurological:  Negative for dizziness and headaches.  Psychiatric/Behavioral:  Negative for depression, hallucinations and suicidal ideas. The patient is not nervous/anxious.   All other systems reviewed and are negative.  Blood pressure 126/71, pulse (!) 106, temperature (!) 97.5 F (36.4 C), temperature source Oral, resp. rate 16, height 6' 2" (1.88 m), weight 90.7 kg, SpO2 98 %. Body mass index is 25.68 kg/m.   Treatment Plan Summary: Bryant Saye is a 30 year old male who [facility] to [facility] uvoluntarily with his mother who is his guardian. He has a history of difficulty functioning in work/school settings beginning in elementary school; has never held a job for >9 months, generally doesn't make friends, go to parties, etc. Was diagnosed with ADHD as a child. Had generally been living with his parents as an adult. This June, he lost 20 pounds due to "exercise" alongside long-standing social withdrawal. He then began pacing and started talking about the [employer] and brainwashing. At some point, he went out and bought a few cases  of beer, covered himself in gasoline and made suicidal threats; he was arrested which led to some charges and his mom taking guardianship over him. In interview, he does describe some elements of mania (impuslivity, grandiosity, flight of ideas, sleep deficiency) around that time. Has not had other discrete periods of mania aside from sleeplessness with normal mood when overconsuming energy drinks - currently has some grandiosity but no other sx. Overall he appears to meet criteria for schizophreniform disorder due to duration of psychotic sx >1 month and <6 months; given some of the symptoms of mania from initial episode may meet criteria for schizoaffective disorder in a few months. He was started on abilify due to bad experiences on risperidone and zyprexa and pt and mother preference for LAI option. He suffered a witnessed seizure over the weekend of 9/3-9/4 after getting olanzapine on top of abilify; his psychotropic medication has since  been streamlined. Neurology started keppra, which we transitioned to depakote after further discussion with neurology due to side effects of irritability, depression, etc. Given manic symptoms as above depakote is overall a better medication for him.   Continue Seizure precautions as ordered  Labs reviewed- CT heard normal, HIV and RPR negative, Vitamin B12- 255  ESR-normal, Hb A1c 5.5, TSH 1.052, lipid panel shows cholesterol 159, LDL 103, Triglycerides 67, VLDL 13,   Qtc 413 from ED in EKG 9/4 Repeat COVID negative (03/13/21) Electrolytes- wnl, Glucose 104. CBC- wnl Heavy metal - normal, ANA, - normal ceruloplasmin -19.1 normal Depakote levels will be drawn in the evening.  Plan Daily contact with patient to assess and evaluate symptoms and progress in treatment, Medication management     Schizophreniform d/o -Continue Abilify 7.5 mg PO daily. Not planning for LAI while in the hospital due to recent seizure episode. Will possibly recommend it for outpatient  basis.  -- depakote as adjunctive for this diagnosis (officially prescribed for seizures) - Depakote levels will be drawn in the evening today.  Seizure - Neurology Consulted, Appreciate recommendations.  - Seizure precautions. --Continue Depakote sprinkles 750 mg twice daily.   Insomnia:  -Continue Melatonin 10 mg qhs   Toothache -Viscous lidocaine solution for mouth Q3H PRN  Other PRN's  -Acetaminophen 650 mg tablet PO every 6 hours as needed for pian -Maalox 333-545-62 mg/5ML suspension 43m every 4 hours as needed for indigestion -Milk of Magnesia 30 mL PO daily as needed for constipation   15 minute safety checks Encourage participation in the therapeutic milieu Discharge - planning in progress, possible discharge for tomorrow..Marland Kitchen  PGY2 VArmando Reichert MD 03/16/2021, 11:18 AM

## 2021-03-16 NOTE — Progress Notes (Signed)
DAR Note: Patient is calm and cooperative, and is focused on being discharged. Pt denies SI/HI/AVH, reports a good night's sleep and denies any other concerns. Pt took all of his morning meds, and Q15 minute checks are being maintained for safety.   03/16/21 1043  Psych Admission Type (Psych Patients Only)  Admission Status Voluntary  Psychosocial Assessment  Patient Complaints Worrying  Eye Contact Fair  Facial Expression Anxious  Affect Appropriate to circumstance  Speech Logical/coherent  Interaction Assertive  Motor Activity Other (Comment) (unremarkable, steady gait)  Appearance/Hygiene Unremarkable  Behavior Characteristics Cooperative;Appropriate to situation  Mood Pleasant;Euthymic  Aggressive Behavior  Targets  (no aggressive behaviors exhibited so far)  Thought Process  Coherency WDL  Content WDL  Delusions None reported or observed  Perception WDL  Hallucination None reported or observed  Judgment Poor  Confusion None  Danger to Self  Current suicidal ideation? Denies  Danger to Others  Danger to Others None reported or observed

## 2021-03-16 NOTE — [facility] Group Notes (Signed)
Adult Psychoeducational Group Note  Date:  03/16/2021 Time:  3:43 PM  Group Topic/Focus:  Developing a Wellness Toolbox:   The focus of this group is to help patients develop a "wellness toolbox" with skills and strategies to promote recovery upon discharge.  Participation Level:  Active  Participation Quality:  Appropriate  Affect:  Appropriate  Cognitive:  Appropriate  Insight: Appropriate  Engagement in Group:  Supportive  Modes of Intervention:  Exploration  Additional Comments:  Patient was engaged and participated at an appropriate level.  Reymundo Poll 03/16/2021, 3:43 PM

## 2021-03-16 NOTE — Progress Notes (Signed)
CSW spoke with patient guardian regarding discharge tomorrow.  Mother agreed that his father should be able to pick up. Mother, guardian, was able to sign consents for patient with follow up appointments and gave verbal consent for father to pick up.    Jessalynn Mccowan, LCSW, LCAS Clincal Social Worker  [facility]

## 2021-03-16 NOTE — [facility] Group Notes (Signed)
The focus of this group is to help patients establish daily goals to achieve during treatment and discuss how the patient can incorporate goal setting into their daily lives to aide in recovery.  Patient stated "Im continuing to work on taking my medication on a regular basis" Patient understand the importance of this as evidenced by verbalizing he will be able to go home when his blood work is back, which will show that he is taking his medication.

## 2021-03-16 NOTE — Progress Notes (Signed)
Justin Ponce was up and visible on the unit.  He stated his day was great and looking forward to leaving soon.  He denied SI/HI or AVH.  He denied any pain or discomfort and appeared to be in no physical distress.  He is currently resting with his eyes closed and appears to be asleep.  Q 15 minute checks maintained for safety.   03/15/21 2121  Psych Admission Type (Psych Patients Only)  Admission Status Voluntary  Psychosocial Assessment  Patient Complaints Worrying (about discharge)  Eye Contact Fair  Facial Expression Anxious  Affect Appropriate to circumstance  Speech Logical/coherent  Interaction Assertive  Motor Activity Other (Comment) (unremarkable)  Appearance/Hygiene Unremarkable  Behavior Characteristics Cooperative;Appropriate to situation  Mood Anxious;Pleasant  Thought Process  Coherency WDL  Content WDL  Delusions None reported or observed  Perception WDL  Hallucination None reported or observed  Judgment Poor  Confusion None  Danger to Self  Current suicidal ideation? Denies  Danger to Others  Danger to Others None reported or observed

## 2021-03-16 NOTE — Progress Notes (Signed)
Pt participated in the evening wrap up group. Pt is looking forward to discharge, reports his medication is back on track, and is grateful for his parents. Positive thinking and positive change were discussed.

## 2021-03-17 LAB — RESP PANEL BY RT-PCR (FLU A&B, COVID) ARPGX2
Influenza A by PCR: NEGATIVE
Influenza B by PCR: NEGATIVE
SARS Coronavirus 2 by RT PCR: NEGATIVE

## 2021-03-17 MED ORDER — MELATONIN 10 MG PO TABS: 10.0000 mg | ORAL_TABLET | Freq: Every day | ORAL | 0 refills | Status: AC

## 2021-03-17 MED ORDER — ARIPIPRAZOLE 15 MG PO TABS: 7.5000 mg | ORAL_TABLET | Freq: Every day | ORAL | 0 refills | Status: AC

## 2021-03-17 MED ORDER — DIVALPROEX SODIUM 125 MG PO CSDR: 750.0000 mg | DELAYED_RELEASE_CAPSULE | Freq: Two times a day (BID) | ORAL | 0 refills | Status: DC

## 2021-03-17 NOTE — Progress Notes (Signed)
D: Pt A & O X 3. Denies SI, HI, AVH and pain at this time. D/C home as ordered. Picked up in lobby by "My father". A: D/C instructions reviewed with pt including prescriptions, medication samples and follow up appointments; compliance encouraged. Pt had no belongings at time of departure. Scheduled medications given with verbal education and effects monitored. Safety checks maintained without incident till time of d/c.  R: Pt receptive to care. Compliant with medications when offered. Denies adverse drug reactions when assessed. Verbalized understanding related to d/c instructions. Signed belonging sheet in agreement with no items received from locker at time of d/c.  Ambulatory with a steady gait. Appears to be in no physical distress at time of departure.

## 2021-03-17 NOTE — Group Note (Signed)
Recreation Therapy Group Note   Group Topic:Stress Management  Group Date: 03/17/2021 Start Time: 1020 End Time: 1040 Facilitators: Caroll Rancher, LRT/CTRS Location: [address] Dayroom  Goal Area(s) Addresses:  Patient will review and complete packet supporting identification of stressors and and techniques to combat compounding stress.   Description:  LRT provided pt a workbook reviewing stress management concepts and offering an opportunity to create a plan to address stressors post d/c. Pt given the option to complete the packet in the dayroom with RN/MHT staff and peers or at their own pace in their room.   Affect/Mood: Appropriate   Participation Level: None   Participation Quality: None   Behavior: N/A   Speech/Thought Process: Coherent   Insight: Good   Judgement: Good   Modes of Intervention: Independent Packet   Patient Response to Interventions:  Attentive   Education Outcome:  Acknowledges education   Clinical Observations/Individualized Feedback: Pt did not receive a packet due to preparing for discharge.    Plan: Continue to engage patient in RT group sessions 2-3x/week.   Caroll Rancher, LRT/CTRS 03/17/2021 1:09 PM

## 2021-03-17 NOTE — [facility] Suicide Risk Assessment (Signed)
[facility] Discharge Suicide Risk Assessment   Principal Problem: Schizophreniform disorder [facility]) Discharge Diagnoses: Principal Problem:   Schizophreniform disorder (HCC) Active Problems:   Seizure (HCC)   Total Time spent with patient: 15 minutes and additionally 16 minutes in chart review and discharge paperwork. Please see Dr. Coralyn Helling discharge summary which I will edit and/or attest for full evaluation from today. Today continued to have reality based conversation with minimal delusional content. Using humor in interview, more spontaneous thought. No suicidal thoughts voiced to [place] or nursing staff for several days. No homicidal content voiced this admission.   Assessment: Justin Ponce is a 30 year old male who presented to [facility] uvoluntarily with his mother who is his guardian. He has a history of difficulty functioning in work/school settings beginning in elementary school; has never held a job for >9 months, generally doesn't make friends, go to parties, etc. Was diagnosed with ADHD as a child. Had generally been living with his parents as an adult. This June, he lost 20 pounds due to "exercise" alongside long-standing social withdrawal. He then began pacing and started talking about the [employer] and brainwashing. At some point, he went out and bought a few cases of beer, covered himself in gasoline and made suicidal threats; he was arrested which led to some charges and his mom taking guardianship over him. In interview, he does describe some elements of mania (impuslivity, grandiosity, flight of ideas, sleep deficiency) around that time. Has not had other discrete periods of mania aside from sleeplessness with normal mood when overconsuming energy drinks - currently has some grandiosity but no other sx. Overall he appears to meet criteria for schizophreniform disorder due to predominance of psychotic symptoms duration of psychotic sx >1 month and <6 months; given some of the symptoms of mania from initial  episode may meet criteria for schizoaffective disorder in a few months; may appear more like bipolar d/o with psychotic features clinically as well. Regardless of diagnosis, abilify and depakote is a good medication combination irrespective of neurology's input into seizure disorder.  He was started on abilify due to bad experiences on risperidone and zyprexa and pt and mother preference for LAI option. He suffered a witnessed seizure over the weekend of 9/3-9/4 after getting olanzapine on top of abilify; his psychotropic medication has since been streamlined. Neurology started keppra, which we transitioned to depakote after further discussion with neurology due to side effects of irritability, depression, etc. Given manic symptoms as above depakote is overall a good medication for him.     Musculoskeletal: Strength & Muscle Tone: within normal limits Gait & Station: normal Patient leans: N/A  Psychiatric Specialty Exam  Presentation  General Appearance: Appropriate for Environment; Casual Eye Contact:Good Speech:Clear and Coherent Speech Volume:Normal Handedness:Right   Mood and Affect  Mood:-- (Good, a little anxious about dc) Duration of Depression Symptoms: Greater than two weeks Affect:Appropriate; Full Range (appropriate to context of conversation)   Thought Process  Thought Processes:Coherent Descriptions of Associations:Intact Orientation:Full (Time, Place and Person) Thought Content:-- (Continues to have "creativity" (unlikely business ideas) however each day has been more realistic) History of Schizophrenia/Schizoaffective disorder:No (duration <6 months) Duration of Psychotic Symptoms:No data recorded Hallucinations:Hallucinations: None Ideas of Reference:None Suicidal Thoughts:Suicidal Thoughts: No Homicidal Thoughts:Homicidal Thoughts: No   Sensorium  Memory:Immediate Good; Recent Good; Remote Good Judgment:Fair Insight:Good   Executive Functions   Concentration:Fair Attention Span:Fair Recall:Good Fund of Knowledge:Good Language:Good   Psychomotor Activity  Psychomotor Activity:Psychomotor Activity: Normal   Assets  Assets:Communication Skills; Desire for  Improvement; Housing; Social Support; Resilience; Physical Health; Talents/Skills; Transportation   Sleep  Sleep:Sleep: Fair Number of Hours of Sleep: 6.75   Physical Exam: CONSTITUTIONAL: Conversant, well developed male in NAD.  Vitals:  Vitals:   03/17/21 0553 03/17/21 0555  BP: 126/76 135/72  Pulse: 75 89  Resp: 18   Temp:  97.7 F (36.5 C)  SpO2:  98%   EYES: Anicteric sclerae; no lid-lag or proptosis.  RESPIRATORY: Normal respiratory effort.  CARDIOVASCULAR: No peripheral edema.  SKIN: No rash, lesions or ulcers visible MUSCULOSKELETAL No digital cyanosis. Normal gait and station.  NEURO: Cranial nerves II--XII grossly intact.  PSYCH: Intact judgment and insight. A&OX3 with a cordial affect.  Patient generally denied items on complete ROS. Headaches present earlier in ad mission have resolved.  Blood pressure 135/72, pulse 89, temperature 97.7 F (36.5 C), temperature source Oral, resp. rate 18, height 6\' 2"  (1.88 m), weight 90.7 kg, SpO2 98 %. Body mass index is 25.68 kg/m.  Mental Status Per Nursing Assessment::   On Admission:  NA   Suicide risk assessment  Patient had  following modifiable risk factors for suicide: untreated psychosis vs mania with psychotic features,  ?seizure disorder, which we addressed by treatment with abilify and depakote. We also ensured good followup mental health care and called mother/guardian and provided psychoeducation multiple times during hospitalization. No access to guns (in safe which pt cannot access).   Patient has following non-modifiable or demographic risk factors for suicide: male gender, history of suicide attempt, history of self harm behavior, and psychiatric hospitalization, legal issues  Patient  has the following protective factors against suicide: Access to outpatient mental health care and Supportive family  Demographic Factors:  Male and Caucasian  Loss Factors: Decrease in vocational status and Legal issues  Historical Factors: NSSIB in setting of psychosis which is being treated  Risk Reduction Factors:   Sense of responsibility to family, Living with another person, especially a relative, Positive social support, and Positive therapeutic relationship  Continued Clinical Symptoms:  Continued elevated creativity/?grandiose delusions of business ideas, pt has appeared more logical and realistic daily   Cognitive Features That Contribute To Risk:  Loss of executive function due to recent psychosis, chronically living at home/relying on parents (poor premorbid executive functioning)  Suicide Risk:  Mild:  Suicidal ideation of limited frequency, intensity, duration, and specificity.  There are no identifiable plans, no associated intent, mild dysphoria and related symptoms, good self-control (both objective and subjective assessment), few other risk factors, and identifiable protective factors, including available and accessible social support.   Follow-up Information     Services, [facility]. Go on 03/22/2021.   Why: You have a hospital follow up appointment for therapy and medication management services on 03/22/21 at 11:00 am.   This appointment will be held in person.  Please bring photo ID, insurance card and social securith card (if available). Contact information: [address] [place] [phone]         [facility]. Schedule an appointment as soon as possible for a visit .   Contact information: [address]     [address] [place] [phone]        [place] eligibility. Call.   Why: Please call and participate in screener for eligibility for VA benefits. Contact  information: [phone] or  [phone]                Plan Of Care/Follow-up recommendations:  Activity:  no restrictions Diet:  no restrictions Tests:  will need monitoring of depakote at f/u Other:  continued attention to legal issues  Young Berry Elainna Eshleman 03/17/2021, 10:41 AM

## 2021-03-17 NOTE — Progress Notes (Signed)
Kaimana was pleasant and cooperative this evening.  He was excited to tell this Clinical research associate that he finally got his lab work completed.  He is looking forward to being discharged.  He denied SI/HI or AVH.  He took his bedtime medications without difficulty.  He is currently resting with his eyes closed and appears to be asleep.  Q 15 minute checks maintained for safety.   03/16/21 2005  Psych Admission Type (Psych Patients Only)  Admission Status Voluntary  Psychosocial Assessment  Patient Complaints Worrying  Eye Contact Fair  Facial Expression Anxious  Affect Appropriate to circumstance  Speech Logical/coherent  Interaction Assertive  Motor Activity Other (Comment) (Unremarkable)  Appearance/Hygiene Unremarkable  Behavior Characteristics Cooperative;Appropriate to situation  Mood Preoccupied;Pleasant;Euthymic  Thought Process  Coherency WDL  Content WDL  Delusions None reported or observed  Perception WDL  Hallucination None reported or observed  Judgment Poor  Confusion None  Danger to Self  Current suicidal ideation? Denies  Danger to Others  Danger to Others None reported or observed
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
## 2021-03-17 NOTE — [facility] Group Notes (Signed)
Adult Psychoeducational Group Note  Date:  03/17/2021 Time:  9:03 AM  Group Topic/Focus:  Goals Group:   The focus of this group is to help patients establish daily goals to achieve during treatment and discuss how the patient can incorporate goal setting into their daily lives to aide in recovery.  Modes of Intervention:  Activity  Additional Comments:  Pt is discharging today and has a goal of staying out of the hospital.   Justin Ponce Kunio Cummiskey 03/17/2021, 9:03 AM

## 2021-04-06 ENCOUNTER — Ambulatory Visit (INDEPENDENT_AMBULATORY_CARE_PROVIDER_SITE_OTHER): Payer: Self-pay | Admitting: Neurology

## 2021-04-06 ENCOUNTER — Other Ambulatory Visit: Payer: Self-pay

## 2021-04-06 ENCOUNTER — Encounter: Payer: Self-pay | Admitting: Neurology

## 2021-04-06 VITALS — BP 135/97 | HR 82 | Ht 74.0 in | Wt 220.0 lb

## 2021-04-06 DIAGNOSIS — G40909 Epilepsy, unspecified, not intractable, without status epilepticus: Secondary | ICD-10-CM

## 2021-04-06 MED ORDER — DIVALPROEX SODIUM 125 MG PO CSDR: 500.0000 mg | DELAYED_RELEASE_CAPSULE | Freq: Two times a day (BID) | ORAL | 3 refills | Status: AC

## 2021-04-06 NOTE — Patient Instructions (Addendum)
Decrease Depakote to 500 mg BID due to side effect of daytime tiredness   Return in 6 months for follow up
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
## 2021-10-05 ENCOUNTER — Ambulatory Visit: Payer: Self-pay | Admitting: Neurology

## 2021-10-18 DIAGNOSIS — Z0271 Encounter for disability determination: Secondary | ICD-10-CM

## 2022-07-21 IMAGING — CT CT HEAD W/O CM
4 series · 17 of 47 positions shown, 19 images · non-contrast
Comparison: None.

CLINICAL DATA: Psychosis

EXAM:
CT HEAD WITHOUT CONTRAST
TECHNIQUE: Contiguous axial images were obtained from the base of the skull
through the vertex without intravenous contrast.

[Series 2: head wo · axial · 0.44mm/px · z∈[-42,+68]mm · 7 of 30 slices shown, 9 images]
[im 4/30  brain]
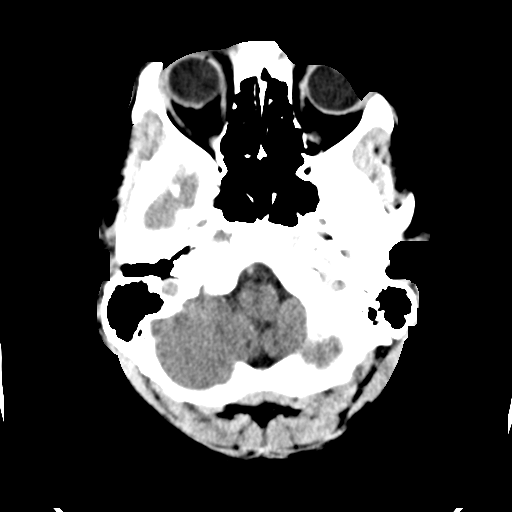
[im 4/30  bone]
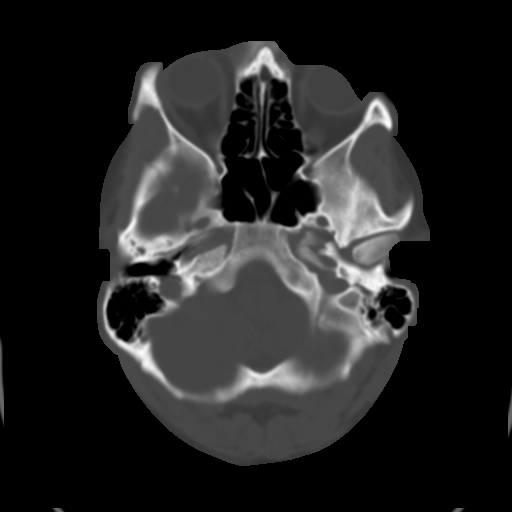
[im 8/30  brain]
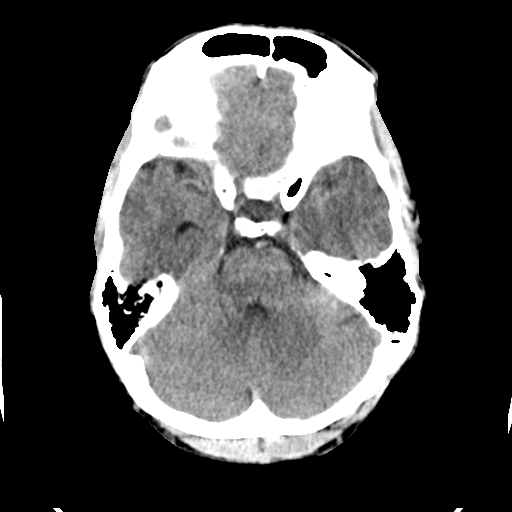
[im 11/30  brain]
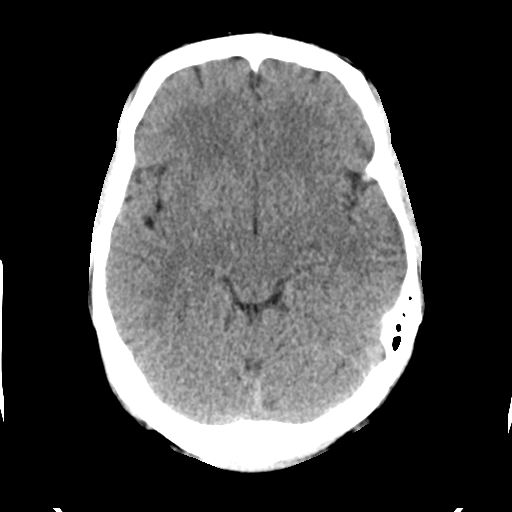
[im 15/30  brain]
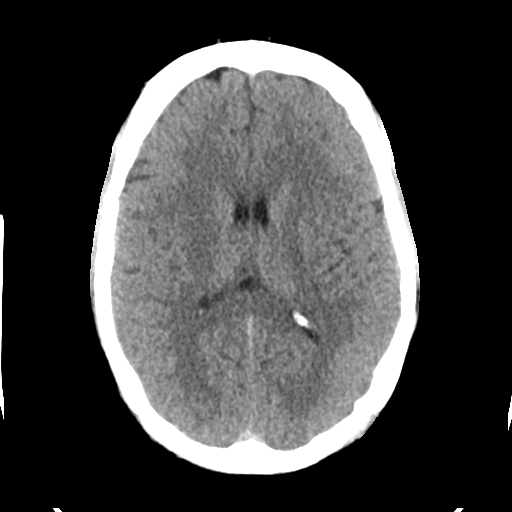
[im 19/30  brain]
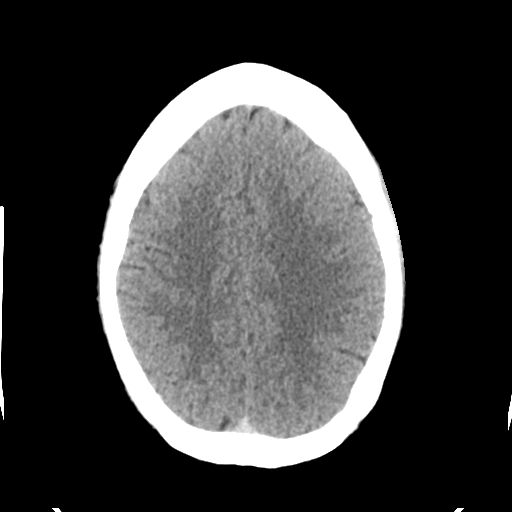
[im 19/30  bone]
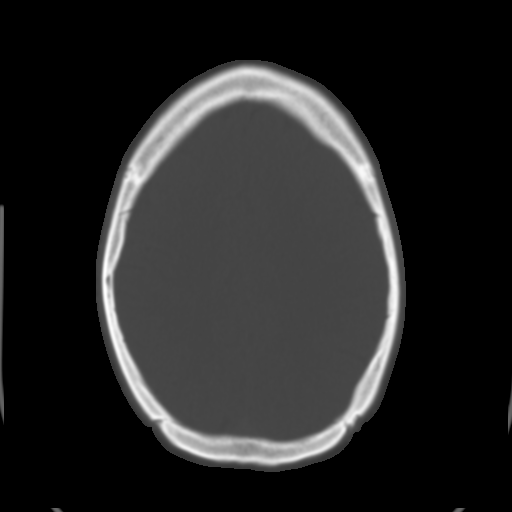
[im 22/30  brain]
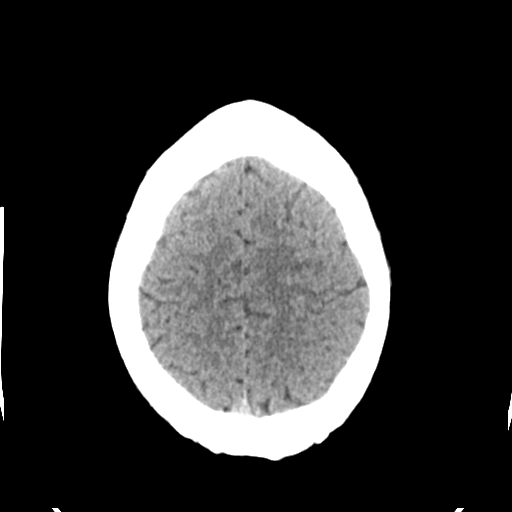
[im 26/30  brain]
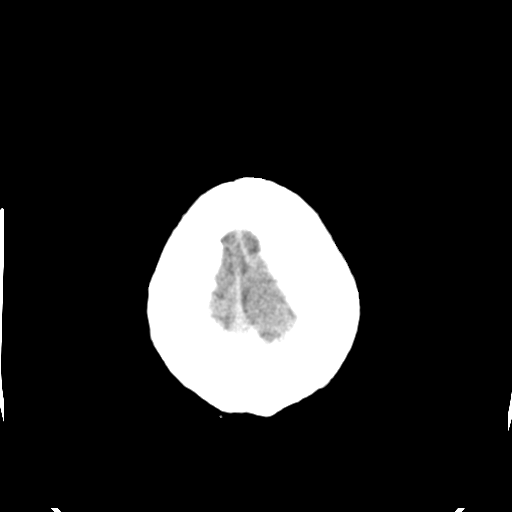

[Series 3: head bone · axial · 0.44mm/px · z∈[-43,+9]mm · 4 of 75 slices shown]
[im 8/75  bone]
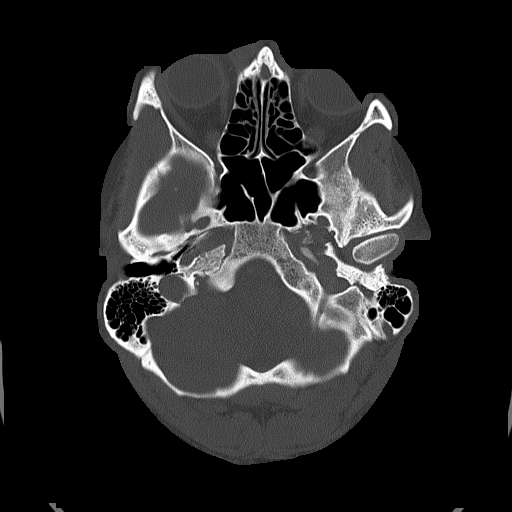
[im 15/75  bone]
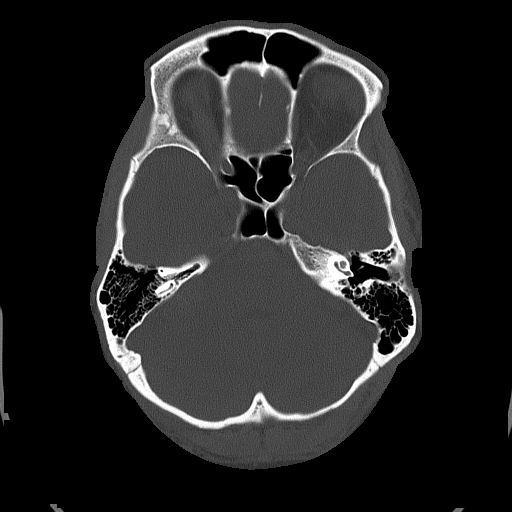
[im 23/75  bone]
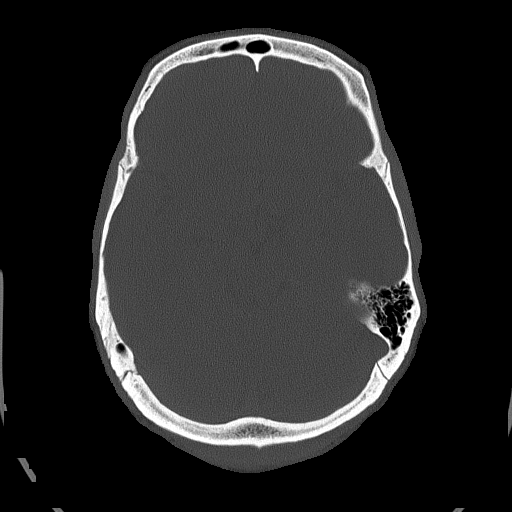
[im 34/75  bone]
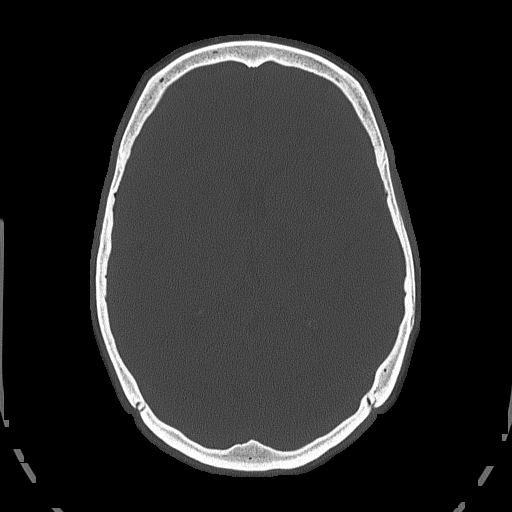

[Series 4: coronal soft tissue · coronal · 0.31mm/px · 3 of 67 slices shown]
[im 23/67  brain]
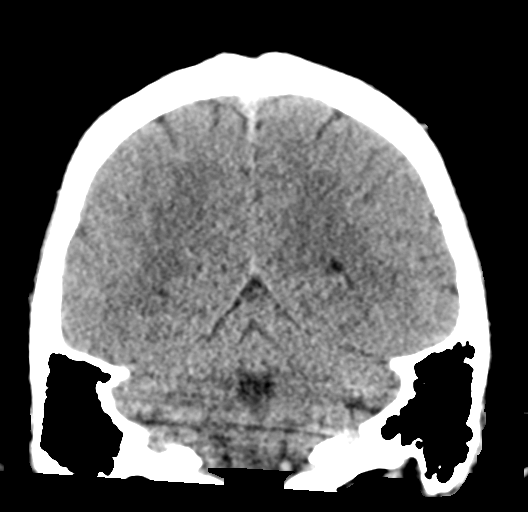
[im 30/67  brain]
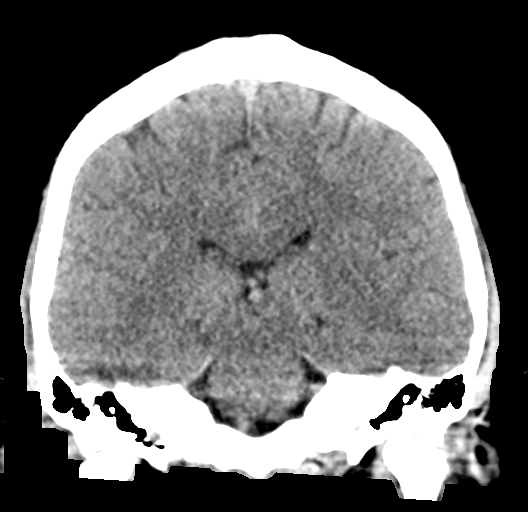
[im 37/67  brain]
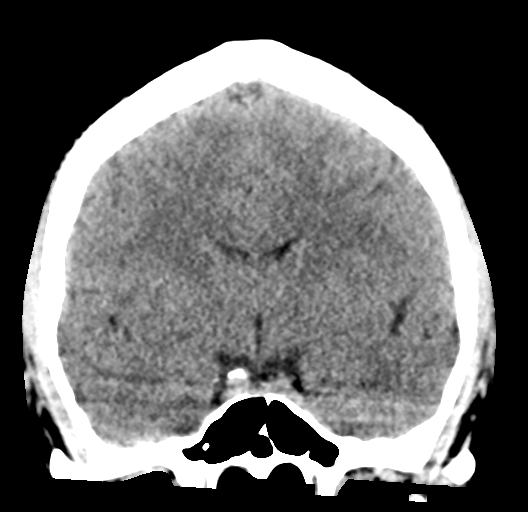

[Series 5: sagittal soft tissue · sagittal · 0.31mm/px · 3 of 53 slices shown]
[im 18/53  brain]
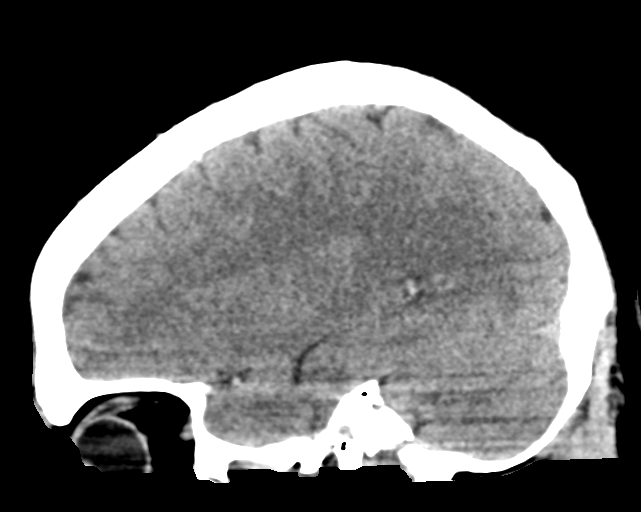
[im 27/53  brain]
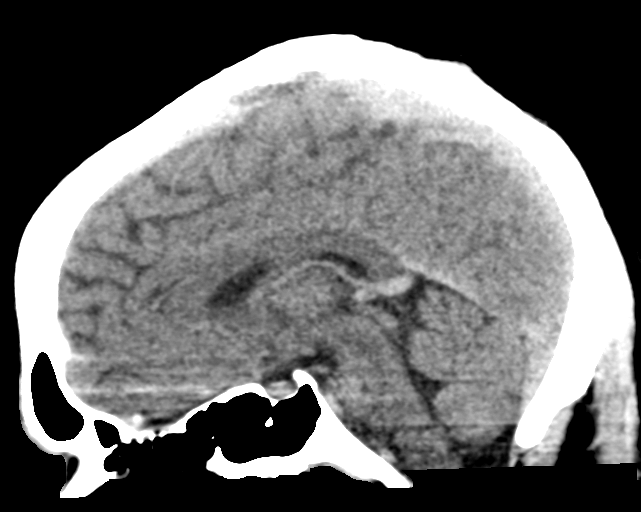
[im 35/53  brain]
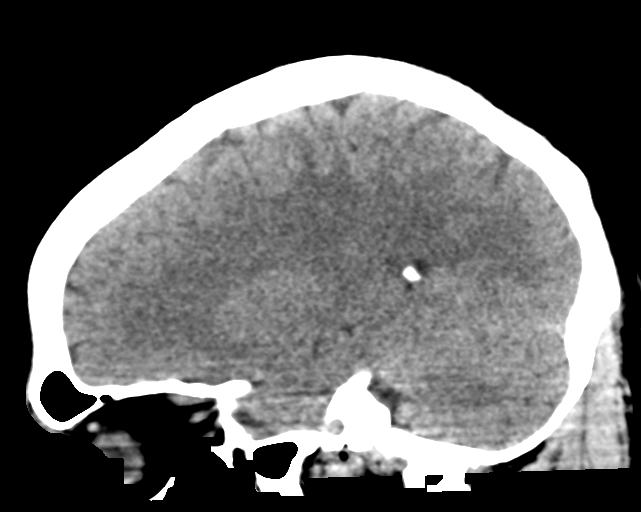

[17 of 47 positions shown; findings below may reference images not displayed]

FINDINGS: Brain: No evidence of acute infarction, hemorrhage, hydrocephalus,
extra-axial collection or mass lesion/mass effect.

Vascular: Negative for hyperdense vessel

Skull: Negative

Sinuses/Orbits: Negative

Other: None
IMPRESSION: Normal ct head
